# Patient Record
Sex: Female | Born: 1975 | Race: Black or African American | Hispanic: No | Marital: Married | Smoking: Never smoker
Health system: Southern US, Community
[De-identification: ages and names within clinical notes are randomized; demographics above are authoritative.]

## PROBLEM LIST (undated history)

## (undated) DIAGNOSIS — K219 Gastro-esophageal reflux disease without esophagitis: Secondary | ICD-10-CM

## (undated) DIAGNOSIS — E785 Hyperlipidemia, unspecified: Secondary | ICD-10-CM

## (undated) DIAGNOSIS — T7840XA Allergy, unspecified, initial encounter: Secondary | ICD-10-CM

## (undated) DIAGNOSIS — O24419 Gestational diabetes mellitus in pregnancy, unspecified control: Secondary | ICD-10-CM

## (undated) DIAGNOSIS — R002 Palpitations: Secondary | ICD-10-CM

## (undated) HISTORY — DX: Gestational diabetes mellitus in pregnancy, unspecified control: O24.419

## (undated) HISTORY — DX: Palpitations: R00.2

## (undated) HISTORY — DX: Gastro-esophageal reflux disease without esophagitis: K21.9

## (undated) HISTORY — DX: Hyperlipidemia, unspecified: E78.5

## (undated) HISTORY — DX: Allergy, unspecified, initial encounter: T78.40XA

---

## 2019-05-24 LAB — HM HIV SCREENING LAB: HM HIV Screening: NEGATIVE

## 2019-05-24 LAB — HM HEPATITIS C SCREENING LAB: HM Hepatitis Screen: NEGATIVE

## 2019-12-16 DIAGNOSIS — N390 Urinary tract infection, site not specified: Secondary | ICD-10-CM | POA: Diagnosis not present

## 2019-12-16 DIAGNOSIS — R300 Dysuria: Secondary | ICD-10-CM | POA: Diagnosis not present

## 2020-07-27 DIAGNOSIS — H18832 Recurrent erosion of cornea, left eye: Secondary | ICD-10-CM | POA: Diagnosis not present

## 2020-08-03 DIAGNOSIS — H18832 Recurrent erosion of cornea, left eye: Secondary | ICD-10-CM | POA: Diagnosis not present

## 2021-04-05 ENCOUNTER — Ambulatory Visit (HOSPITAL_BASED_OUTPATIENT_CLINIC_OR_DEPARTMENT_OTHER): Payer: Self-pay | Admitting: Nurse Practitioner

## 2021-04-22 ENCOUNTER — Encounter: Payer: Self-pay | Admitting: Nurse Practitioner

## 2021-04-22 ENCOUNTER — Other Ambulatory Visit (HOSPITAL_COMMUNITY)
Admission: RE | Admit: 2021-04-22 | Discharge: 2021-04-22 | Disposition: A | Discharge status: Home / Self Care | Payer: Federal, State, Local not specified - PPO | Source: Ambulatory Visit | Attending: Nurse Practitioner | Admitting: Nurse Practitioner

## 2021-04-22 ENCOUNTER — Ambulatory Visit: Payer: Federal, State, Local not specified - PPO | Admitting: Nurse Practitioner

## 2021-04-22 ENCOUNTER — Other Ambulatory Visit: Payer: Self-pay

## 2021-04-22 VITALS — BP 132/94 | HR 91 | Temp 97.3°F | Ht 62.0 in | Wt 162.8 lb

## 2021-04-22 DIAGNOSIS — R030 Elevated blood-pressure reading, without diagnosis of hypertension: Secondary | ICD-10-CM

## 2021-04-22 DIAGNOSIS — Z13220 Encounter for screening for lipoid disorders: Secondary | ICD-10-CM

## 2021-04-22 DIAGNOSIS — N898 Other specified noninflammatory disorders of vagina: Secondary | ICD-10-CM | POA: Diagnosis not present

## 2021-04-22 DIAGNOSIS — R002 Palpitations: Secondary | ICD-10-CM

## 2021-04-22 DIAGNOSIS — Z136 Encounter for screening for cardiovascular disorders: Secondary | ICD-10-CM

## 2021-04-22 LAB — LIPID PANEL
Cholesterol: 168 mg/dL (ref 0–200)
HDL: 49.3 mg/dL (ref >39.00)
LDL Cholesterol: 104 mg/dL — ABNORMAL HIGH (ref 0–99)
NonHDL: 118.36
Total CHOL/HDL Ratio: 3
Triglycerides: 71 mg/dL (ref 0.0–149.0)
VLDL: 14.2 mg/dL (ref 0.0–40.0)

## 2021-04-22 LAB — COMPREHENSIVE METABOLIC PANEL
ALT: 12 U/L (ref 0–35)
AST: 15 U/L (ref 0–37)
Albumin: 4.3 g/dL (ref 3.5–5.2)
Alkaline Phosphatase: 80 U/L (ref 39–117)
BUN: 6 mg/dL (ref 6–23)
CO2: 30 mEq/L (ref 19–32)
Calcium: 9.6 mg/dL (ref 8.4–10.5)
Chloride: 100 mEq/L (ref 96–112)
Creatinine, Ser: 0.64 mg/dL (ref 0.40–1.20)
GFR: 104.86 mL/min (ref >60.00)
Glucose, Bld: 227 mg/dL — ABNORMAL HIGH (ref 70–99)
Potassium: 3.6 mEq/L (ref 3.5–5.1)
Sodium: 136 mEq/L (ref 135–145)
Total Bilirubin: 0.6 mg/dL (ref 0.2–1.2)
Total Protein: 7.8 g/dL (ref 6.0–8.3)

## 2021-04-22 LAB — CBC WITH DIFFERENTIAL/PLATELET
Basophils Absolute: 0 10*3/uL (ref 0.0–0.1)
Basophils Relative: 0.4 % (ref 0.0–3.0)
Eosinophils Absolute: 0 10*3/uL (ref 0.0–0.7)
Eosinophils Relative: 0.5 % (ref 0.0–5.0)
HCT: 33.3 % — ABNORMAL LOW (ref 36.0–46.0)
Hemoglobin: 10.9 g/dL — ABNORMAL LOW (ref 12.0–15.0)
Lymphocytes Relative: 29.1 % (ref 12.0–46.0)
Lymphs Abs: 1.4 10*3/uL (ref 0.7–4.0)
MCHC: 32.7 g/dL (ref 30.0–36.0)
MCV: 76.5 fl — ABNORMAL LOW (ref 78.0–100.0)
Monocytes Absolute: 0.3 10*3/uL (ref 0.1–1.0)
Monocytes Relative: 7.2 % (ref 3.0–12.0)
Neutro Abs: 3 10*3/uL (ref 1.4–7.7)
Neutrophils Relative %: 62.8 % (ref 43.0–77.0)
Platelets: 306 10*3/uL (ref 150.0–400.0)
RBC: 4.35 Mil/uL (ref 3.87–5.11)
RDW: 14.7 % (ref 11.5–15.5)
WBC: 4.8 10*3/uL (ref 4.0–10.5)

## 2021-04-22 LAB — TSH: TSH: 1.89 u[IU]/mL (ref 0.35–5.50)

## 2021-04-22 NOTE — Progress Notes (Signed)
EKG interpreted by me on 04/22/21 showed normal sinus rhythm, heart rate 78, no ST or T wave changes. ? ?

## 2021-04-22 NOTE — Assessment & Plan Note (Addendum)
Endorses palpitations intermittently for the past 6 months.  She does have increased stress recently and her mom did pass away in October.  She had these before several years ago and had worn a Holter monitor which was unrevealing.  EKG done today shows normal sinus rhythm, heart rate 78, no ST or T wave changes.  Encouraged her to limit caffeine, which she does not drink a lot of anyway, and to make sure that she exercises.  Check CMP, CBC, TSH today.  We will place referral to cardiology since this has been going on for 6 months. ?

## 2021-04-22 NOTE — Patient Instructions (Signed)
It was great to see you! ? ?Your EKG was reassuring today. We are going to check some labs and will let you know the results. We will also let you know the results of your swab for the vaginal discharge.  ? ?I have placed a referral to cardiology.  ? ?Let's follow-up in 4 weeks, sooner if you have concerns. ? ?If a referral was placed today, you will be contacted for an appointment. Please note that routine referrals can sometimes take up to 3-4 weeks to process. Please call our office if you haven't heard anything after this time frame. ? ?Take care, ? ?Rodman Pickle, NP ? ?

## 2021-04-22 NOTE — Progress Notes (Signed)
? ?New Patient Office Visit ? ?Subjective:  ?Patient ID: Courtney Koch, female    DOB: 04-Jun-1975  Age: 46 y.o. MRN: [id number] ? ?CC:  ?Chief Complaint  ?Patient presents with  ? Establish Care  ?  Np. Est care. Pt c/o heart flutters x6 mos and mild abd cramps w/ some discharge for several days  ? ? ?HPI ?Courtney Koch presents for new patient visit to establish care.  Introduced to Publishing rights manager role and practice setting.  All questions answered.  Discussed provider/patient relationship and expectations. ? ?She has been experiencing some heart fluttering episodes for the past few months. This happened in 2005. She wore a monitor and was told nothing was wrong. This is intermittent, some weeks she doesn't have it. She doesn't drink any caffeine. She does endorse some stress at work. She also lost her mom in October. Denies chest pain and shortness of breath.  ? ?She also noted some vaginal discharge after her last menstrual cycle. She notices the discharge in the mornings and evenings. She does have intermittent cramping. Usually she only has cramps before her menstrual cycle. She describes the discharge as white. Denies odor, dysuria, recent antibiotic use.  ? ?Depression and anxiety screening done: ? ?Depression screen [facility] 2/9 04/22/2021  ?Down, Depressed, Hopeless 0  ?PHQ - 2 Score 0  ?Altered sleeping 0  ?Tired, decreased energy 0  ?Change in appetite 0  ?Feeling bad or failure about yourself  0  ?Trouble concentrating 0  ?Moving slowly or fidgety/restless 0  ?Suicidal thoughts 0  ?PHQ-9 Score 0  ? ?GAD 7 : Generalized Anxiety Score 04/22/2021  ?Nervous, Anxious, on Edge 0  ?Control/stop worrying 0  ?Worry too much - different things 0  ?Trouble relaxing 1  ?Restless 0  ?Easily annoyed or irritable 0  ?Afraid - awful might happen 0  ?Total GAD 7 Score 1  ? ? ?Past Medical History:  ?Diagnosis Date  ? Allergy 06/2000  ? Gestational diabetes   ? ? ?Past Surgical History:  ?Procedure Laterality Date  ? CESAREAN  SECTION  06/2000, 06/2002, 12/2007  ? ? ?History reviewed. No pertinent family history. ? ?Social History  ? ?Socioeconomic History  ? Marital status: Married  ?  Spouse name: Not on file  ? Number of children: Not on file  ? Years of education: Not on file  ? Highest education level: Not on file  ?Occupational History  ? Not on file  ?Tobacco Use  ? Smoking status: Never  ? Smokeless tobacco: Never  ?Vaping Use  ? Vaping Use: Never used  ?Substance and Sexual Activity  ? Alcohol use: Yes  ?  Alcohol/week: 1.0 standard drink  ?  Types: 1 Glasses of wine per week  ?  Comment: Occasionally when we go out.  One drink every 4 or so mths.  ? Drug use: Never  ? Sexual activity: Yes  ?  Birth control/protection: None  ?Other Topics Concern  ? Not on file  ?Social History Narrative  ? Not on file  ? ?Social Determinants of Health  ? ?Financial Resource Strain: Not on file  ?Food Insecurity: Not on file  ?Transportation Needs: Not on file  ?Physical Activity: Not on file  ?Stress: Not on file  ?Social Connections: Not on file  ?Intimate Partner Violence: Not on file  ? ? ?ROS ?Review of Systems  ?Constitutional:  Positive for fatigue.  ?HENT: Negative.    ?Eyes: Negative.   ?Respiratory: Negative.    ?Cardiovascular:  Positive  for palpitations. Negative for chest pain and leg swelling.  ?Gastrointestinal: Negative.   ?Genitourinary:  Positive for vaginal discharge. Negative for dysuria and frequency.  ?Musculoskeletal: Negative.   ?Skin: Negative.   ?Neurological: Negative.   ?Psychiatric/Behavioral:  Positive for sleep disturbance.   ? ?Objective:  ? ?Today's Vitals: BP (!) 132/94 (BP Location: Right Arm, Cuff Size: Normal)   Pulse 91   Temp (!) 97.3 ?F (36.3 ?C) (Temporal)   Ht 5\' 2"  (1.575 m)   Wt 162 lb 12.8 oz (73.8 kg)   LMP 04/10/2021 (Exact Date)   SpO2 100%   BMI 29.78 kg/m?  ? ?Physical Exam ?Vitals and nursing note reviewed.  ?Constitutional:   ?   General: She is not in acute distress. ?   Appearance: Normal  appearance.  ?HENT:  ?   Head: Normocephalic and atraumatic.  ?   Right Ear: Tympanic membrane, ear canal and external ear normal.  ?   Left Ear: Tympanic membrane, ear canal and external ear normal.  ?   Nose: Nose normal.  ?   Mouth/Throat:  ?   Mouth: Mucous membranes are moist.  ?   Pharynx: Oropharynx is clear.  ?Eyes:  ?   Conjunctiva/sclera: Conjunctivae normal.  ?Cardiovascular:  ?   Rate and Rhythm: Normal rate and regular rhythm.  ?   Pulses: Normal pulses.  ?   Heart sounds: Normal heart sounds.  ?Pulmonary:  ?   Effort: Pulmonary effort is normal.  ?   Breath sounds: Normal breath sounds.  ?Abdominal:  ?   General: Bowel sounds are normal.  ?   Palpations: Abdomen is soft.  ?   Tenderness: There is no abdominal tenderness.  ?Musculoskeletal:     ?   General: Normal range of motion.  ?   Cervical back: Normal range of motion. No tenderness.  ?   Right lower leg: No edema.  ?   Left lower leg: No edema.  ?Lymphadenopathy:  ?   Cervical: No cervical adenopathy.  ?Skin: ?   General: Skin is warm and dry.  ?Neurological:  ?   General: No focal deficit present.  ?   Mental Status: She is alert and oriented to person, place, and time.  ?Psychiatric:     ?   Mood and Affect: Mood normal.     ?   Behavior: Behavior normal.     ?   Thought Content: Thought content normal.     ?   Judgment: Judgment normal.  ? ? ?Assessment & Plan:  ? ?Problem List Items Addressed This Visit   ? ?  ? Other  ? Palpitations - Primary  ?  Endorses palpitations intermittently for the past 6 months.  She does have increased stress recently and her mom did pass away in October.  She had these before several years ago and had worn a Holter monitor which was unrevealing.  EKG done today shows normal sinus rhythm, heart rate 78, no ST or T wave changes.  Encouraged her to limit caffeine, which she does not drink a lot of anyway, and to make sure that she exercises.  Check CMP, CBC, TSH today.  We will place referral to cardiology since this  has been going on for 6 months. ?  ?  ? Relevant Orders  ? EKG 12-Lead (Completed)  ? CBC with Differential/Platelet  ? Comprehensive metabolic panel  ? TSH  ? Ambulatory referral to Cardiology  ? ?Other Visit Diagnoses   ? ?  Vaginal discharge      ? No abdominal pain on exam.  We will check wet prep for BV, yeast, trichomoniasis.  Will treat based on results  ? Relevant Orders  ? Cervicovaginal ancillary only  ? Encounter for lipid screening for cardiovascular disease      ? Relevant Orders  ? Lipid panel  ? ?  ? ? ?No outpatient encounter medications on file as of 04/22/2021.  ? ?No facility-administered encounter medications on file as of 04/22/2021.  ? ? ?Follow-up: Return in about 4 weeks (around 05/20/2021) for CPE.  ? ?Gerre ScullLauren A Rithwik Schmieg, NP ? ?

## 2021-04-22 NOTE — Assessment & Plan Note (Signed)
BP 132/94 today on recheck.  Encouraged her to start checking her blood pressure at home daily and writing it down.  Encouraged her to limit the amount of salt in her diet and make sure that she is exercising.  Follow-up in 4 weeks. ?

## 2021-04-25 ENCOUNTER — Other Ambulatory Visit: Payer: Self-pay

## 2021-04-25 ENCOUNTER — Encounter: Payer: Self-pay | Admitting: Nurse Practitioner

## 2021-04-25 ENCOUNTER — Ambulatory Visit: Payer: Federal, State, Local not specified - PPO | Admitting: Internal Medicine

## 2021-04-25 VITALS — BP 132/84 | HR 100 | Ht 62.0 in | Wt 156.0 lb

## 2021-04-25 DIAGNOSIS — R002 Palpitations: Secondary | ICD-10-CM | POA: Diagnosis not present

## 2021-04-25 DIAGNOSIS — N898 Other specified noninflammatory disorders of vagina: Secondary | ICD-10-CM

## 2021-04-25 LAB — CERVICOVAGINAL ANCILLARY ONLY
Bacterial Vaginitis (gardnerella): NEGATIVE
Candida Glabrata: NEGATIVE
Candida Vaginitis: NEGATIVE
Comment: NEGATIVE
Comment: NEGATIVE
Comment: NEGATIVE
Comment: NEGATIVE
Trichomonas: NEGATIVE

## 2021-04-25 NOTE — Patient Instructions (Signed)

## 2021-04-25 NOTE — Progress Notes (Signed)
?Cardiology Office Note:   ? ?Date:  04/25/2021  ? ?ID:  Courtney Koch, DOB [birth year], MRN [id number] ? ?PCP:  Charyl Dancer, NP ?  ?[facility] Providers ?Cardiologist:  None    ? ?Referring MD: Charyl Dancer, NP  ? ?No chief complaint on file. ?Palpitations ? ?History of Present Illness:   ? ?Courtney Koch is a 46 y.o. female with a hx of gestational DM, allergies referral from PCP for palpitations ? ?She notes heart flutters. She noted it in 2005. She wore a heart monitor and it was normal. She feels more frequent symptoms.  It lasts for seconds. About 5 seconds.  No caffeine. No syncope. She notes symptoms more since her mom's passing 6 months ago.  ? ?EKG-NSR ? ?Past Medical History:  ?Diagnosis Date  ? Allergy 06/2000  ? Gestational diabetes   ? ? ?Past Surgical History:  ?Procedure Laterality Date  ? CESAREAN SECTION  06/2000, 06/2002, 12/2007  ? ? ?Current Medications: ?No outpatient medications have been marked as taking for the 04/25/21 encounter (Appointment) with Janina Mayo, MD.  ?  ? ?Allergies:   Percocet [oxycodone-acetaminophen]  ? ?Social History  ? ?Socioeconomic History  ? Marital status: Married  ?  Spouse name: Not on file  ? Number of children: Not on file  ? Years of education: Not on file  ? Highest education level: Not on file  ?Occupational History  ? Not on file  ?Tobacco Use  ? Smoking status: Never  ? Smokeless tobacco: Never  ?Vaping Use  ? Vaping Use: Never used  ?Substance and Sexual Activity  ? Alcohol use: Yes  ?  Alcohol/week: 1.0 standard drink  ?  Types: 1 Glasses of wine per week  ?  Comment: Occasionally when we go out.  One drink every 4 or so mths.  ? Drug use: Never  ? Sexual activity: Yes  ?  Birth control/protection: None  ?Other Topics Concern  ? Not on file  ?Social History Narrative  ? Not on file  ? ?Social Determinants of Health  ? ?Financial Resource Strain: Not on file  ?Food Insecurity: Not on file  ?Transportation Needs: Not on file  ?Physical  Activity: Not on file  ?Stress: Not on file  ?Social Connections: Not on file  ?  ? ?Family History: ?The patient's no family hx of heart disease or SCD ? ?ROS:   ?Please see the history of present illness.    ? All other systems reviewed and are negative. ? ?EKGs/Labs/Other Studies Reviewed:   ? ?The following studies were reviewed today: ? ? ?EKG:  EKG is  ordered today.  The ekg ordered today demonstrates  ? ?NSR, no interval changes ? ?Recent Labs: ?04/22/2021: ALT 12; BUN 6; Creatinine, Ser 0.64; Hemoglobin 10.9; Platelets 306.0; Potassium 3.6; Sodium 136; TSH 1.89  ?Recent Lipid Panel ?   ?Component Value Date/Time  ? CHOL 168 04/22/2021 1137  ? TRIG 71.0 04/22/2021 1137  ? HDL 49.30 04/22/2021 1137  ? CHOLHDL 3 04/22/2021 1137  ? VLDL 14.2 04/22/2021 1137  ? [place] 104 (H) 04/22/2021 1137  ? ? ? ?Risk Assessment/Calculations:   ?  ? ?    ? ?Physical Exam:   ? ?VS:  LMP 04/10/2021 (Exact Date)    ? ?Wt Readings from Last 3 Encounters:  ?04/22/21 162 lb 12.8 oz (73.8 kg)  ?  ? ?GEN:  Well nourished, well developed in no acute distress ?HEENT: Normal ?NECK: No JVD; No carotid bruits ?  LYMPHATICS: No lymphadenopathy ?CARDIAC: RRR, no murmurs, rubs, gallops ?RESPIRATORY:  Clear to auscultation without rales, wheezing or rhonchi  ?ABDOMEN: Soft, non-tender, non-distended ?MUSCULOSKELETAL:  No edema; No deformity  ?SKIN: Warm and dry ?NEUROLOGIC:  Alert and oriented x 3 ?PSYCHIATRIC:  Normal affect  ? ?ASSESSMENT:   ? ?#Palpitations: Only lasts a few seconds. No LH, dizziness. She does not have high risk features including syncope c/f arrhythmia , family hx of SCD, or abnormalities on her EKG. Discussed may be related to grief/anxiety. We discussed that if she develops long standing palpitations, LH, dizziness or syncope to let us know; otherwise she does not need to continue with following cardiology ? ?PLAN:   ? ?In order of problems listed above: ? ?Follow up PRN ? ?   ? ?   ? ? ?Medication Adjustments/Labs and  Tests Ordered: ?Current medicines are reviewed at length with the patient today.  Concerns regarding medicines are outlined above.  ?No orders of the defined types were placed in this encounter. ? ?No orders of the defined types were placed in this encounter. ? ? ?There are no Patient Instructions on file for this visit.  ? ?Signed, ?Janina Mayo, MD  ?04/25/2021 1:22 PM    ?[place] ?

## 2021-04-29 NOTE — Progress Notes (Signed)
? ?Established Patient Office Visit ? ?Subjective:  ?Patient ID: Courtney Koch, female    DOB: 08/26/1975  Age: 46 y.o. MRN: [id number] ? ?CC:  ?Chief Complaint  ?Patient presents with  ? Blood Sugar Problem  ?  F/u elevated BS  ? ? ?HPI ?[name] presents for follow-up on glucose from recent labs.  Her glucose was 227.  She has a history of gestational diabetes, her last was 13 years ago.  She denies any signs of high blood sugars including change in vision, urinary frequency, extra thirsty/hungry, numbness or tingling in her feet or hands.  She denies chest pain, shortness of breath. ? ?Past Medical History:  ?Diagnosis Date  ? Allergy 06/2000  ? Gestational diabetes   ? ? ?Past Surgical History:  ?Procedure Laterality Date  ? CESAREAN SECTION  06/2000, 06/2002, 12/2007  ? ? ?History reviewed. No pertinent family history. ? ?Social History  ? ?Socioeconomic History  ? Marital status: Married  ?  Spouse name: Not on file  ? Number of children: Not on file  ? Years of education: Not on file  ? Highest education level: Not on file  ?Occupational History  ? Not on file  ?Tobacco Use  ? Smoking status: Never  ? Smokeless tobacco: Never  ?Vaping Use  ? Vaping Use: Never used  ?Substance and Sexual Activity  ? Alcohol use: Yes  ?  Alcohol/week: 1.0 standard drink  ?  Types: 1 Glasses of wine per week  ?  Comment: Occasionally when we go out.  One drink every 4 or so mths.  ? Drug use: Never  ? Sexual activity: Yes  ?  Birth control/protection: None  ?Other Topics Concern  ? Not on file  ?Social History Narrative  ? Not on file  ? ?Social Determinants of Health  ? ?Financial Resource Strain: Not on file  ?Food Insecurity: Not on file  ?Transportation Needs: Not on file  ?Physical Activity: Not on file  ?Stress: Not on file  ?Social Connections: Not on file  ?Intimate Partner Violence: Not on file  ? ? ?No outpatient medications prior to visit.  ? ?No facility-administered medications prior to visit.  ? ? ?Allergies   ?Allergen Reactions  ? Percocet [Oxycodone-Acetaminophen] Rash and Swelling  ? ? ?ROS ?Review of Systems ?See pertinent positives and negatives per HPI. ?  ?Objective:  ?  ?Physical Exam ?Vitals and nursing note reviewed.  ?Constitutional:   ?   General: She is not in acute distress. ?   Appearance: Normal appearance.  ?HENT:  ?   Head: Normocephalic and atraumatic.  ?Eyes:  ?   Conjunctiva/sclera: Conjunctivae normal.  ?Cardiovascular:  ?   Rate and Rhythm: Normal rate.  ?Pulmonary:  ?   Effort: Pulmonary effort is normal.  ?Musculoskeletal:  ?   Cervical back: Normal range of motion.  ?Skin: ?   General: Skin is warm and dry.  ?Neurological:  ?   General: No focal deficit present.  ?   Mental Status: She is alert and oriented to person, place, and time.  ?Psychiatric:     ?   Mood and Affect: Mood normal.     ?   Behavior: Behavior normal.     ?   Thought Content: Thought content normal.     ?   Judgment: Judgment normal.  ? ? ?BP 132/90 (BP Location: Right Arm, Cuff Size: Normal)   Pulse 83   Temp (!) 96.4 ?F (35.8 ?C) (Temporal)   Wt 155  lb 9.6 oz (70.6 kg)   LMP 04/10/2021 (Exact Date)   SpO2 94%   BMI 28.46 kg/m?  ?Wt Readings from Last 3 Encounters:  ?05/02/21 155 lb 9.6 oz (70.6 kg)  ?04/25/21 156 lb (70.8 kg)  ?04/22/21 162 lb 12.8 oz (73.8 kg)  ? ? ? ?Health Maintenance Due  ?Topic Date Due  ? FOOT EXAM  Never done  ? OPHTHALMOLOGY EXAM  Never done  ? HIV Screening  Never done  ? Hepatitis C Screening  Never done  ? TETANUS/TDAP  Never done  ? PAP SMEAR-Modifier  Never done  ? COLONOSCOPY (Pts 45-33yr Insurance coverage will need to be confirmed)  Never done  ? ? ?There are no preventive care reminders to display for this patient. ? ?Lab Results  ?Component Value Date  ? TSH 1.89 04/22/2021  ? ?Lab Results  ?Component Value Date  ? WBC 4.8 04/22/2021  ? HGB 10.9 (L) 04/22/2021  ? HCT 33.3 (L) 04/22/2021  ? MCV 76.5 (L) 04/22/2021  ? PLT 306.0 04/22/2021  ? ?Lab Results  ?Component Value Date  ? NA  136 04/22/2021  ? K 3.6 04/22/2021  ? CO2 30 04/22/2021  ? GLUCOSE 227 (H) 04/22/2021  ? BUN 6 04/22/2021  ? CREATININE 0.64 04/22/2021  ? BILITOT 0.6 04/22/2021  ? ALKPHOS 80 04/22/2021  ? AST 15 04/22/2021  ? ALT 12 04/22/2021  ? PROT 7.8 04/22/2021  ? ALBUMIN 4.3 04/22/2021  ? CALCIUM 9.6 04/22/2021  ? GFR 104.86 04/22/2021  ? ?Lab Results  ?Component Value Date  ? CHOL 168 04/22/2021  ? ?Lab Results  ?Component Value Date  ? HDL 49.30 04/22/2021  ? ?Lab Results  ?Component Value Date  ? LDLCALC 104 (H) 04/22/2021  ? ?Lab Results  ?Component Value Date  ? TRIG 71.0 04/22/2021  ? ?Lab Results  ?Component Value Date  ? CHOLHDL 3 04/22/2021  ? ?Lab Results  ?Component Value Date  ? HGBA1C 9.0 (A) 05/02/2021  ? HGBA1C 9.0 05/02/2021  ? HGBA1C 9.0 (A) 05/02/2021  ? HGBA1C 9.0 (A) 05/02/2021  ? ? ?  ?Assessment & Plan:  ? ?Problem List Items Addressed This Visit   ? ?  ? Endocrine  ? Diabetes mellitus without complication (HAmasa  ?  A1c today is 9.0%.  She does have a history of gestational diabetes, her last pregnancy was 13 years ago.  Discussed limiting carbs and added sugars, along with exercise.  She would like to try just changing her lifestyle first before starting any medications.  Discussed yearly eye exam.  Also discussed starting statin for cardiovascular protection and ACE/ARB for kidney protection.  We will discuss this further at next visit.  Discussed checking her fasting blood sugars every morning.  We will refer her to nutritionist to help with diet.  Follow-up in 3 months or sooner with concerns. ?  ?  ? Relevant Orders  ? Amb ref to Medical Nutrition Therapy-MNT  ? ?Other Visit Diagnoses   ? ? IFG (impaired fasting glucose)    -  Primary  ? Glucose 227 on last labs on 04/22/2021.  A1c today 9.0%.  See plan for diabetes.  ? Relevant Orders  ? POCT HgB A1C (Completed)  ? ?  ? ? ?Meds ordered this encounter  ?Medications  ? Blood Glucose Monitoring Suppl (ONE TOUCH ULTRA 2) w/Device KIT  ?  Sig: 1 each by  Does not apply route daily.  ?  Dispense:  1 kit  ?  Refill:  0  ? ? ?Follow-up: Return in about 3 months (around 08/02/2021) for Diabetes.  ? ?A total of 30 minutes were spent on this encounter today. When total time is documented, this includes both the face-to-face and non-face-to-face time personally spent before, during and after the visit on the date of the encounter.  Specifically we discussed the diagnosis of diabetes, possible complications, monitoring blood sugar at home, dietary changes, yearly eye exams.  She was given the opportunity to ask questions.  We also discussed medications including glucose lowering, statins for cardiovascular protection, ACE/ARB for kidney protection. ? ? ?Charyl Dancer, NP ?

## 2021-05-02 ENCOUNTER — Ambulatory Visit: Payer: Federal, State, Local not specified - PPO | Admitting: Nurse Practitioner

## 2021-05-02 ENCOUNTER — Ambulatory Visit: Payer: Self-pay | Admitting: Nurse Practitioner

## 2021-05-02 ENCOUNTER — Encounter: Payer: Self-pay | Admitting: Nurse Practitioner

## 2021-05-02 VITALS — BP 132/90 | HR 83 | Temp 96.4°F | Wt 155.6 lb

## 2021-05-02 DIAGNOSIS — E119 Type 2 diabetes mellitus without complications: Secondary | ICD-10-CM

## 2021-05-02 DIAGNOSIS — R7301 Impaired fasting glucose: Secondary | ICD-10-CM | POA: Diagnosis not present

## 2021-05-02 LAB — POCT GLYCOSYLATED HEMOGLOBIN (HGB A1C)
HbA1c POC (<> result, manual entry): 9 % (ref 4.0–5.6)
HbA1c, POC (controlled diabetic range): 9 % — AB (ref 0.0–7.0)
HbA1c, POC (prediabetic range): 9 % — AB (ref 5.7–6.4)
Hemoglobin A1C: 9 % — AB (ref 4.0–5.6)

## 2021-05-02 MED ORDER — ONETOUCH ULTRA 2 W/DEVICE KIT: 1.0000 | PACK | Freq: Every day | 0 refills | Status: AC

## 2021-05-02 NOTE — Assessment & Plan Note (Signed)
A1c today is 9.0%.  She does have a history of gestational diabetes, her last pregnancy was 13 years ago.  Discussed limiting carbs and added sugars, along with exercise.  She would like to try just changing her lifestyle first before starting any medications.  Discussed yearly eye exam.  Also discussed starting statin for cardiovascular protection and ACE/ARB for kidney protection.  We will discuss this further at next visit.  Discussed checking her fasting blood sugars every morning.  We will refer her to nutritionist to help with diet.  Follow-up in 3 months or sooner with concerns. ?

## 2021-05-02 NOTE — Patient Instructions (Signed)
It was great to see you! ? ?Your blood sugar every morning before you eat.  The goal is to have your sugar less than 130.  If you decide to check it later on in the day, your sugar should be less than 200-2 hours after eating.  ? ?I recommend that you follow up with your eye doctor yearly.  I also recommend that you look at your feet daily to make sure that you are not getting any blisters or wounds that you are we are not able to feel. ? ?I am placing a referral to a dietitian, they should be calling you to schedule this visit.  I am also attaching a bunch of information for you to look through on diabetes. ? ?We will consider starting a medication to protect your kidneys, and a statin to prevent heart attack and stroke at your next visit. ? ?Let's follow-up in 3 months, sooner if you have concerns. ? ?If a referral was placed today, you will be contacted for an appointment. Please note that routine referrals can sometimes take up to 3-4 weeks to process. Please call our office if you haven't heard anything after this time frame. ? ?Take care, ? ?Rodman Pickle, NP ? ?

## 2021-05-03 MED ORDER — ONETOUCH ULTRA VI STRP: ORAL_STRIP | 12 refills | Status: DC

## 2021-05-03 NOTE — Progress Notes (Signed)
? ? ?McElwee, Lauren A, NP ? ? ?Chief Complaint  ?Patient presents with  ? Referral  ?  Clear of sx today, was having vag discharge only.   ? ? ?HPI: ?     [name] is a 46 y.o. No obstetric history on file. whose LMP was Patient's last menstrual period was 04/10/2021 (exact date)., presents today for NP eval of increased vag d/c, no itching/irritation/odor; referred by PCP. Had neg culture for BV, yeast, trich 04/22/21. Sx were after her period and pt states they have since resolved.  ?She has not had a recent pap smear, no hx of abn paps.  ?She is sex active, partner with vasectomy. No new partners. No bleeding/dryness with sex. Has noticed some RLQ cramping after sex recently, lasting about 10 min. No pain during sex. No hx of ovar cysts, no GI sx.  ? ?Patient Active Problem List  ? Diagnosis Date Noted  ? Diabetes mellitus without complication ([place]) 82/50/5397  ? Palpitations 04/22/2021  ? Elevated blood-pressure reading without diagnosis of hypertension 04/22/2021  ? ? ?Past Surgical History:  ?Procedure Laterality Date  ? CESAREAN SECTION  06/2000, 06/2002, 12/2007  ? ? ?History reviewed. No pertinent family history. ? ?Social History  ? ?Socioeconomic History  ? Marital status: Married  ?  Spouse name: Not on file  ? Number of children: Not on file  ? Years of education: Not on file  ? Highest education level: Not on file  ?Occupational History  ? Not on file  ?Tobacco Use  ? Smoking status: Never  ? Smokeless tobacco: Never  ?Vaping Use  ? Vaping Use: Never used  ?Substance and Sexual Activity  ? Alcohol use: Yes  ?  Alcohol/week: 1.0 standard drink  ?  Types: 1 Glasses of wine per week  ?  Comment: Occasionally when we go out.  One drink every 4 or so mths.  ? Drug use: Never  ? Sexual activity: Yes  ?  Birth control/protection: None  ?Other Topics Concern  ? Not on file  ?Social History Narrative  ? Not on file  ? ?Social Determinants of Health  ? ?Financial Resource Strain: Not on file  ?Food  Insecurity: Not on file  ?Transportation Needs: Not on file  ?Physical Activity: Not on file  ?Stress: Not on file  ?Social Connections: Not on file  ?Intimate Partner Violence: Not on file  ? ? ?Outpatient Medications Prior to Visit  ?Medication Sig Dispense Refill  ? Blood Glucose Monitoring Suppl (ONE TOUCH ULTRA 2) w/Device KIT 1 each by Does not apply route daily. 1 kit 0  ? glucose blood (ONETOUCH ULTRA) test strip Use as instructed 100 each 12  ? ?No facility-administered medications prior to visit.  ? ? ? ? ?ROS: ? ?Review of Systems  ?Constitutional:  Negative for fever.  ?Gastrointestinal:  Negative for blood in stool, constipation, diarrhea, nausea and vomiting.  ?Genitourinary:  Negative for dyspareunia, dysuria, flank pain, frequency, hematuria, urgency, vaginal bleeding, vaginal discharge and vaginal pain.  ?Musculoskeletal:  Negative for back pain.  ?Skin:  Negative for rash.  ?BREAST: No symptoms ? ? ?OBJECTIVE:  ? ?Vitals:  ?BP 110/80   Ht _0  (1.575 m)   Wt 155 lb (70.3 kg)   LMP 04/10/2021 (Exact Date)   BMI 28.35 kg/m?  ? ?Physical Exam ?Vitals reviewed.  ?Constitutional:   ?   Appearance: She is well-developed.  ?Pulmonary:  ?   Effort: Pulmonary effort is normal.  ?Abdominal:  ?  Palpations: Abdomen is soft.  ?   Tenderness: There is no abdominal tenderness.  ?Genitourinary: ?   General: Normal vulva.  ?   Pubic Area: No rash.   ?   Labia:     ?   Right: No rash, tenderness or lesion.     ?   Left: No rash, tenderness or lesion.   ?   Vagina: Normal. No vaginal discharge, erythema or tenderness.  ?   Cervix: Normal.  ?   Uterus: Normal. Not enlarged and not tender.   ?   Adnexa: Right adnexa normal and left adnexa normal.    ?   Right: No mass or tenderness.      ?   Left: No mass or tenderness.    ?Musculoskeletal:     ?   General: Normal range of motion.  ?   Cervical back: Normal range of motion.  ?Skin: ?   General: Skin is warm and dry.  ?Neurological:  ?   General: No focal deficit  present.  ?   Mental Status: She is alert and oriented to person, place, and time.  ?Psychiatric:     ?   Mood and Affect: Mood normal.     ?   Behavior: Behavior normal.     ?   Thought Content: Thought content normal.     ?   Judgment: Judgment normal.  ? ? ?Assessment/Plan: ?Vaginal discharge--sx resolved. F/u prn. Reassurance.  ? ?Cervical cancer screening - Plan: Cytology - PAP ? ?Screening for HPV (human papillomavirus) - Plan: Cytology - PAP ? ? ? Return if symptoms worsen or fail to improve. ? ?Collier Monica B. Dione Petron, PA-C ?05/04/2021 ?9:53 AM ? ? ? ? ? ?

## 2021-05-04 ENCOUNTER — Other Ambulatory Visit (HOSPITAL_COMMUNITY)
Admission: RE | Admit: 2021-05-04 | Discharge: 2021-05-04 | Disposition: A | Discharge status: Home / Self Care | Payer: Federal, State, Local not specified - PPO | Source: Ambulatory Visit | Attending: Obstetrics and Gynecology | Admitting: Obstetrics and Gynecology

## 2021-05-04 ENCOUNTER — Other Ambulatory Visit: Payer: Self-pay

## 2021-05-04 ENCOUNTER — Encounter: Payer: Self-pay | Admitting: Obstetrics and Gynecology

## 2021-05-04 ENCOUNTER — Other Ambulatory Visit: Payer: Self-pay | Admitting: Nurse Practitioner

## 2021-05-04 ENCOUNTER — Ambulatory Visit: Payer: Federal, State, Local not specified - PPO | Admitting: Obstetrics and Gynecology

## 2021-05-04 VITALS — BP 110/80 | Ht 62.0 in | Wt 155.0 lb

## 2021-05-04 DIAGNOSIS — Z124 Encounter for screening for malignant neoplasm of cervix: Secondary | ICD-10-CM

## 2021-05-04 DIAGNOSIS — Z1231 Encounter for screening mammogram for malignant neoplasm of breast: Secondary | ICD-10-CM

## 2021-05-04 DIAGNOSIS — N898 Other specified noninflammatory disorders of vagina: Secondary | ICD-10-CM | POA: Diagnosis not present

## 2021-05-04 DIAGNOSIS — Z1151 Encounter for screening for human papillomavirus (HPV): Secondary | ICD-10-CM | POA: Diagnosis not present

## 2021-05-04 NOTE — Patient Instructions (Signed)
I value your feedback and you entrusting us with your care. If you get a [facility] patient survey, I would appreciate you taking the time to let us know about your experience today. Thank you! ? ? ?

## 2021-05-05 LAB — CYTOLOGY - PAP
Comment: NEGATIVE
Diagnosis: NEGATIVE
High risk HPV: NEGATIVE

## 2021-05-13 ENCOUNTER — Ambulatory Visit
Admission: RE | Admit: 2021-05-13 | Discharge: 2021-05-13 | Disposition: A | Discharge status: Home / Self Care | Payer: Federal, State, Local not specified - PPO | Source: Ambulatory Visit

## 2021-05-13 DIAGNOSIS — Z1231 Encounter for screening mammogram for malignant neoplasm of breast: Secondary | ICD-10-CM | POA: Diagnosis not present

## 2021-05-20 ENCOUNTER — Encounter: Payer: Federal, State, Local not specified - PPO | Admitting: Nurse Practitioner

## 2021-05-23 ENCOUNTER — Encounter: Payer: Self-pay | Admitting: Nurse Practitioner

## 2021-05-23 ENCOUNTER — Ambulatory Visit (INDEPENDENT_AMBULATORY_CARE_PROVIDER_SITE_OTHER): Payer: Federal, State, Local not specified - PPO | Admitting: Nurse Practitioner

## 2021-05-23 VITALS — BP 116/74 | HR 83 | Temp 97.8°F | Resp 18 | Wt 155.0 lb

## 2021-05-23 DIAGNOSIS — Z1211 Encounter for screening for malignant neoplasm of colon: Secondary | ICD-10-CM

## 2021-05-23 DIAGNOSIS — E119 Type 2 diabetes mellitus without complications: Secondary | ICD-10-CM | POA: Diagnosis not present

## 2021-05-23 DIAGNOSIS — Z0000 Encounter for general adult medical examination without abnormal findings: Secondary | ICD-10-CM

## 2021-05-23 DIAGNOSIS — R030 Elevated blood-pressure reading, without diagnosis of hypertension: Secondary | ICD-10-CM

## 2021-05-23 DIAGNOSIS — Z23 Encounter for immunization: Secondary | ICD-10-CM | POA: Diagnosis not present

## 2021-05-23 NOTE — Progress Notes (Signed)
? ?BP 116/74 (BP Location: Left Arm, Patient Position: Sitting, Cuff Size: Normal)   Pulse 83   Temp 97.8 ?F (36.6 ?C) (Temporal)   Resp 18   Wt 155 lb (70.3 kg)   LMP 05/07/2021 (Exact Date)   SpO2 99%   BMI 28.35 kg/m?   ? ?Subjective:  ? ? Patient ID: Courtney Koch, female    DOB: January 19, 1976, 46 y.o.   MRN: [id number] ? ?CC: ?Chief Complaint  ?Patient presents with  ? Annual Exam  ?  CPE, pt is fasting   ? ?HPI: ?Courtney Koch is a 46 y.o. female presenting on 05/23/2021 for comprehensive medical examination. Current medical complaints include:none ? ?She has been checking her fasting blood sugar daily, and it has ranged from 80-1 20.  She also checked it once 2 hours after eating, and it was 190.  She has been checking her blood pressures at home as well and they have been running 90s-120s/60s-80s. ? ?Depression Screen done today and results listed below:  ? ?  05/23/2021  ? 11:10 AM 04/22/2021  ? 11:14 AM  ?Depression screen PHQ 2/9  ?Decreased Interest 0   ?Down, Depressed, Hopeless 0 0  ?PHQ - 2 Score 0 0  ?Altered sleeping 1 0  ?Tired, decreased energy 0 0  ?Change in appetite 0 0  ?Feeling bad or failure about yourself  0 0  ?Trouble concentrating 0 0  ?Moving slowly or fidgety/restless 0 0  ?Suicidal thoughts 0 0  ?PHQ-9 Score 1 0  ?Difficult doing work/chores Not difficult at all   ? ? ?The patient does not have a history of falls. I did not complete a risk assessment for falls. A plan of care for falls was not documented. ? ? ?Past Medical History:  ?Past Medical History:  ?Diagnosis Date  ? Allergy 06/2000  ? Gestational diabetes   ? ? ?Surgical History:  ?Past Surgical History:  ?Procedure Laterality Date  ? CESAREAN SECTION  06/2000, 06/2002, 12/2007  ? ? ?Medications:  ?Current Outpatient Medications on File Prior to Visit  ?Medication Sig  ? Blood Glucose Monitoring Suppl (ONE TOUCH ULTRA 2) w/Device KIT 1 each by Does not apply route daily.  ? glucose blood (ONETOUCH ULTRA) test strip Use as  instructed  ? ?No current facility-administered medications on file prior to visit.  ? ? ?Allergies:  ?Allergies  ?Allergen Reactions  ? Percocet [Oxycodone-Acetaminophen] Rash and Swelling  ? ? ?Social History:  ?Social History  ? ?Socioeconomic History  ? Marital status: Married  ?  Spouse name: Not on file  ? Number of children: Not on file  ? Years of education: Not on file  ? Highest education level: Not on file  ?Occupational History  ? Not on file  ?Tobacco Use  ? Smoking status: Never  ? Smokeless tobacco: Never  ?Vaping Use  ? Vaping Use: Never used  ?Substance and Sexual Activity  ? Alcohol use: Yes  ?  Alcohol/week: 1.0 standard drink  ?  Types: 1 Glasses of wine per week  ?  Comment: Occasionally when we go out.  One drink every 4 or so mths.  ? Drug use: Never  ? Sexual activity: Yes  ?  Birth control/protection: None  ?Other Topics Concern  ? Not on file  ?Social History Narrative  ? Not on file  ? ?Social Determinants of Health  ? ?Financial Resource Strain: Not on file  ?Food Insecurity: Not on file  ?Transportation Needs: Not on file  ?Physical  Activity: Not on file  ?Stress: Not on file  ?Social Connections: Not on file  ?Intimate Partner Violence: Not on file  ? ?Social History  ? ?Tobacco Use  ?Smoking Status Never  ?Smokeless Tobacco Never  ? ?Social History  ? ?Substance and Sexual Activity  ?Alcohol Use Yes  ? Alcohol/week: 1.0 standard drink  ? Types: 1 Glasses of wine per week  ? Comment: Occasionally when we go out.  One drink every 4 or so mths.  ? ? ?Family History:  ?Family History  ?Problem Relation Age of Onset  ? BRCA 1/2 Neg Hx   ? Breast cancer Neg Hx   ? ? ?Past medical history, surgical history, medications, allergies, family history and social history reviewed with patient today and changes made to appropriate areas of the chart.  ? ?Review of Systems  ?Constitutional: Negative.   ?HENT: Negative.    ?Eyes: Negative.   ?Respiratory: Negative.    ?Cardiovascular: Negative.    ?Gastrointestinal: Negative.   ?Genitourinary: Negative.   ?Musculoskeletal: Negative.   ?Skin: Negative.   ?Neurological: Negative.   ?Psychiatric/Behavioral: Negative.    ?All other ROS negative except what is listed above and in the HPI.  ? ?   ?Objective:  ?  ?BP 116/74 (BP Location: Left Arm, Patient Position: Sitting, Cuff Size: Normal)   Pulse 83   Temp 97.8 ?F (36.6 ?C) (Temporal)   Resp 18   Wt 155 lb (70.3 kg)   LMP 05/07/2021 (Exact Date)   SpO2 99%   BMI 28.35 kg/m?   ?Wt Readings from Last 3 Encounters:  ?05/23/21 155 lb (70.3 kg)  ?05/04/21 155 lb (70.3 kg)  ?05/02/21 155 lb 9.6 oz (70.6 kg)  ?  ?Physical Exam ?Vitals and nursing note reviewed.  ?Constitutional:   ?   General: She is not in acute distress. ?   Appearance: Normal appearance.  ?HENT:  ?   Head: Normocephalic and atraumatic.  ?   Right Ear: Tympanic membrane, ear canal and external ear normal.  ?   Left Ear: Tympanic membrane, ear canal and external ear normal.  ?   Nose: Nose normal.  ?   Mouth/Throat:  ?   Mouth: Mucous membranes are moist.  ?   Pharynx: Oropharynx is clear.  ?Eyes:  ?   Conjunctiva/sclera: Conjunctivae normal.  ?Cardiovascular:  ?   Rate and Rhythm: Normal rate and regular rhythm.  ?   Pulses: Normal pulses.  ?   Heart sounds: Normal heart sounds.  ?Pulmonary:  ?   Effort: Pulmonary effort is normal.  ?   Breath sounds: Normal breath sounds.  ?Abdominal:  ?   Palpations: Abdomen is soft.  ?   Tenderness: There is no abdominal tenderness.  ?Musculoskeletal:     ?   General: Normal range of motion.  ?   Cervical back: Normal range of motion. No tenderness.  ?   Right lower leg: No edema.  ?   Left lower leg: No edema.  ?Lymphadenopathy:  ?   Cervical: No cervical adenopathy.  ?Skin: ?   General: Skin is warm and dry.  ?Neurological:  ?   General: No focal deficit present.  ?   Mental Status: She is alert and oriented to person, place, and time.  ?   Cranial Nerves: No cranial nerve deficit.  ?   Coordination:  Coordination normal.  ?   Gait: Gait normal.  ?Psychiatric:     ?   Mood and Affect: Mood normal.     ?  Behavior: Behavior normal.     ?   Thought Content: Thought content normal.     ?   Judgment: Judgment normal.  ? ?Diabetic Foot Exam - Simple   ?Simple Foot Form ?Visual Inspection ?No deformities, no ulcerations, no other skin breakdown bilaterally: Yes ?Sensation Testing ?Intact to touch and monofilament testing bilaterally: Yes ?Pulse Check ?Posterior Tibialis and Dorsalis pulse intact bilaterally: Yes ?Comments ?  ? ? ?Results for orders placed or performed in visit on 05/23/21  ?[facility] HIV SCREENING LAB  ?Result Value Ref Range  ? [facility] HIV Screening Negative - Patient reported   ?[facility] HEPATITIS C SCREENING LAB  ?Result Value Ref Range  ? [facility] Hepatitis Screen Negative - Patient Reported   ? ?   ?Assessment & Plan:  ? ?Problem List Items Addressed This Visit   ? ?  ? Endocrine  ? Diabetes mellitus without complication ([place])  ?  Fasting blood sugars at home have ranged from 90s - 120s.  She has been making some changes to her diet and increasing her exercise, which is definitely helping her sugars.  We will have her follow-up in 2 months to recheck her A1c.  Discussed yearly eye exams.  She states that she usually goes once a year in September.  We will continue holding off on medications and evaluate further the next visit. ? ?  ?  ?  ? Other  ? Elevated blood-pressure reading without diagnosis of hypertension  ?  Blood pressure is back down to normal range.  Today it was 116/74.  She has been checking her blood pressure at home and writing it down and it has been within normal limits as well.  Encouraged her to keep checking her blood pressure once or twice a week. ? ?  ?  ? ?Other Visit Diagnoses   ? ? Routine general medical examination at a health care facility    -  Primary  ? Health maintenance reviewed and updated.  CMP, CBC reviewed from March 2023.  She is up-to-date on Pap and mammogram.  Follow-up 1 year   ? Need for tetanus, diphtheria, and acellular pertussis (Tdap) vaccine      ? Tdap updated today  ? Relevant Orders  ? Tdap vaccine greater than or equal to 7yo IM (Completed)  ? Screen for colon cancer      ? Discussed various

## 2021-05-23 NOTE — Patient Instructions (Signed)
It was great to see you! ? ?Keep up the great work with your diet changes and exercise!  ? ?I am placing a referral for your colonoscopy and you should get a phone call to schedule ? ?Let's follow-up in June at your scheduled appointment, sooner if you have concerns. ? ?If a referral was placed today, you will be contacted for an appointment. Please note that routine referrals can sometimes take up to 3-4 weeks to process. Please call our office if you haven't heard anything after this time frame. ? ?Take care, ? ?Rodman Pickle, NP ? ?

## 2021-05-23 NOTE — Assessment & Plan Note (Signed)
Blood pressure is back down to normal range.  Today it was 116/74.  She has been checking her blood pressure at home and writing it down and it has been within normal limits as well.  Encouraged her to keep checking her blood pressure once or twice a week. ?

## 2021-05-23 NOTE — Assessment & Plan Note (Addendum)
Fasting blood sugars at home have ranged from 90s - 120s.  She has been making some changes to her diet and increasing her exercise, which is definitely helping her sugars.  We will have her follow-up in 2 months to recheck her A1c.  Discussed yearly eye exams.  She states that she usually goes once a year in September.  We will continue holding off on medications and evaluate further the next visit. ?

## 2021-06-01 ENCOUNTER — Encounter: Payer: Federal, State, Local not specified - PPO | Attending: Nurse Practitioner | Admitting: Dietician

## 2021-06-01 ENCOUNTER — Encounter: Payer: Self-pay | Admitting: Dietician

## 2021-06-01 VITALS — Ht 61.0 in | Wt 153.7 lb

## 2021-06-01 DIAGNOSIS — E119 Type 2 diabetes mellitus without complications: Secondary | ICD-10-CM | POA: Diagnosis not present

## 2021-06-01 NOTE — Patient Instructions (Signed)
Allow for about 45grams of carbs (mostly healthy, unrefined carbs) with each meal. Consistent intake with each meal helps with consistent blood sugar numbers.  ?Great job making healthy food choices! Keep it up! ?Continue with regular exercise. ? ?

## 2021-06-01 NOTE — Progress Notes (Signed)
Medical Nutrition Therapy: Visit start time: 0820  end time: 0920  ?Assessment:  Diagnosis: Type 2 diabetes ?Past medical history: gestational diabetes ?Psychosocial issues/ stress concerns: none ? ?Preferred learning method:  ?Auditory ?Visual ?Hands-on ? ? ?Current weight: 153.7lbs Height: 5'1" BMI: 29.04 ? ?Medications, supplements: none taken at this time ? ?Progress and evaluation:  ?Patient reports having gestational diabetes with all 3 of her pregnancies. She has been working on guidelines she remembers from previous diabetes education. ?Recent HbA1C of 9.0% (05/02/21) she is working on lifestyle changes for the next 3 months before reassessing BG control.  ?She has been testing fasting BGs daily, lowest has been 96, highest 141 since 04/2021. ? ?Physical activity: walking 30 minutes, 5-6 times per week ? ?Dietary Intake:  ?Usual eating pattern includes 2-3 meals and 1 snacks per day. ?Dining out frequency: 1-2 meals per week. ? ?Breakfast: sometimes low sugar inst oatmeal ?Snack: yogurt (2 Good) ?Lunch: salad with salmon ?Snack: none ?Supper: veg + protein + little carb amt or no carb ?Snack: none; occ almond milk unsweetened vanilla ?Beverages: water 32oz container 1.5 - 2x daily, healthy balance (sugar free) juice drink, oc diet soda ? ?Intervention:  ? ?Nutrition Care Education: ?  ?Basic nutrition: basic food groups; appropriate nutrient balance; appropriate meal and snack schedule; general nutrition guidelines    ?Weight control: importance of low sugar and low fat choices, portion control; estimated energy needs for gradual weight loss at 1400 kcal, provided guidance for 45% CHO, 25% pro, 30% fat ?Advanced nutrition:  food label reading for carbohydrates ?Diabetes:  goals for BGs, appropriate meal and snack schedule, appropriate carb intake and balance, healthy carb choices, role of fiber, protein, fat ? ? ?Nutritional Diagnosis:  [place]-2.2 Altered nutrition-related laboratory As related to diabetes.  As  evidenced by elevated HbA1C. ?-3.3 Overweight/obesity As related to history of excess calories.  As evidenced by patient with current BMI of 29, making diet and lifestyle changes to promote weight loss and improve blood sugar control. ? ? ?Education Materials given:  ?General diet guidelines for Diabetes ?Plate Planner with food lists, sample meal pattern ?Visit summary with goals/ instructions ? ? ?Learner/ who was taught:  ?Patient  ? ? ?Level of understanding: ?Verbalizes/ demonstrates competency ? ? ?Demonstrated degree of understanding via:   Teach back ?Learning barriers: ?None ? ?Willingness to learn/ readiness for change: ?Eager, change in progress ? ? ?Monitoring and Evaluation:  Dietary intake, exercise, BG control, and body weight ?     follow up:  07/06/21 at 8:15am   ?

## 2021-06-01 NOTE — Progress Notes (Deleted)
Medical Nutrition Therapy: Visit start time: 0820  end time: 0920  ?Assessment:  Diagnosis: Type 2 diabetes ?Past medical history: *** ?Psychosocial issues/ stress concerns: *** ? ?Preferred learning method:  ?Auditory ?Visual ?Hands-on ?No preference indicated ? ?Current weight: *** Height: *** BMI: *** ? ?Medications, supplements: *** ? ?Progress and evaluation:  ?Patinet reports having gestational diabetes with all 3 of her pregnancies. She has been working on guidelines she remembers from previous diabetes education. ?Recent HbA1C of 9.0% (05/02/21) she is working on lifestyle changes for the next 3 months before reassessing BG control.  ?She has been testing fasting BGs daily, lowest has been 96, highest 141 since 04/2021. ? ?Physical activity: walking 30 minutes, 5-6 times per week ? ?Dietary Intake:  ?Usual eating pattern includes 2-3 meals and 1 snacks per day. ?Dining out frequency: 1-2 meals per week. ? ?Breakfast: sometimes low sugar inst oatmeal ?Snack: yogurt (2 Good) ?Lunch: salad with salmon ?Snack: none ?Supper: veg + protein + little carb amt or no carb ?Snack: none; occ almond milk unsweetened vanilla ?Beverages: water 32oz container 1.5 - 2x daily, healthy balance (sugar free) juice drink, oc diet soda ? ?Intervention:  ? ?Nutrition Care Education: ?  ?Basic nutrition: basic food groups; appropriate nutrient balance; appropriate meal and snack schedule; general nutrition guidelines    ?Weight control: identifying healthy weight, determining reasonable weight loss rate, importance of low sugar and low fat choices, portion control strategies including ***, estimated energy needs at *** kcal, provided guidance for *** ?Advanced nutrition:  recipe modification, cooking techniques, dining out, food label reading ?Diabetes:  goals for BGs, appropriate meal and snack schedule, appropriate carb intake and balance, healthy carb choices, role of fiber, protein, fat ?Hypertension:  importance of controlling  BP, identifying high sodium foods, identifying food sources of potassium, magnesium ?Hyperlipidemia:  target goals for lipids, healthy and unhealthy fats, role of fiber, plant sterols, role of exercise ?Other lifestyle changes:  benefits of making changes, increasing motivation, readiness for change, identifying habits that need to change, alcohol use, food and drug interactions ? ? ?Nutritional Diagnosis:  {[facility] AMB NUTRITIONAL DIAGNOSIS:[phone]} ? ? ?Education Materials given:  ?General diet guidelines for Diabetes ?General diet guidelines for Cholesterol-lowering/ Heart health ?General diet guidelines for Hypertension ?Bariatric surgery guidelines and diet ?Food record ?Plate Planner with food lists, sample meal pattern ?Food label handout ?Dining out resource ?Planning A Balanced Meal ?Recipes ?Sample menus ?Snacking handout ?Visit summary with goals/ instructions ? ? ?Learner/ who was taught:  ?Patient  ?Spouse/ partner ?Family member: *** ?Caregiver/ guardian ? ?Level of understanding: ?Unable to understand/ needs instruction ?Partial understanding; needs review/ practice ?Verbalizes/ demonstrates competency ?Not applicable ? ?Demonstrated degree of understanding via:   Teach back ?Learning barriers: ?None ?Motivation lacking ?Cognitive limitations ?Communication limitations ?Comprehension ?Hearing impairment ?Language: *** ?Learning disability ?Literacy ?Physical limitations ?Visual impairment ? ?Willingness to learn/ readiness for change: ?Eager, change in progress ?Acceptance, ready for change ?Hesitance, contemplating change ?Non-acceptance, not ready for change ? ?Monitoring and Evaluation:  Dietary intake, exercise, ***, and body weight ?     follow up: {follow up:15908}  ?

## 2021-06-02 ENCOUNTER — Ambulatory Visit: Payer: Self-pay | Admitting: Gastroenterology

## 2021-07-06 ENCOUNTER — Ambulatory Visit: Payer: Federal, State, Local not specified - PPO | Admitting: Dietician

## 2021-08-02 ENCOUNTER — Encounter: Payer: Self-pay | Admitting: Nurse Practitioner

## 2021-08-02 ENCOUNTER — Ambulatory Visit: Payer: Federal, State, Local not specified - PPO | Admitting: Nurse Practitioner

## 2021-08-02 VITALS — BP 128/83 | HR 79 | Temp 97.7°F | Wt 154.0 lb

## 2021-08-02 DIAGNOSIS — E119 Type 2 diabetes mellitus without complications: Secondary | ICD-10-CM

## 2021-08-02 LAB — POCT GLYCOSYLATED HEMOGLOBIN (HGB A1C)
HbA1c POC (<> result, manual entry): 6.9 % (ref 4.0–5.6)
HbA1c, POC (controlled diabetic range): 6.9 % (ref 0.0–7.0)
HbA1c, POC (prediabetic range): 6.9 % — AB (ref 5.7–6.4)
Hemoglobin A1C: 6.9 % — AB (ref 4.0–5.6)

## 2021-08-02 LAB — MICROALBUMIN / CREATININE URINE RATIO
Creatinine,U: 158.3 mg/dL
Microalb Creat Ratio: 0.8 mg/g (ref 0.0–30.0)
Microalb, Ur: 1.3 mg/dL (ref 0.0–1.9)

## 2021-08-02 NOTE — Assessment & Plan Note (Signed)
Her sugars have improved to 6.9%.  Congratulated her on this as this was with diet and exercise alone.  Continue nutritional changes.  She can continue checking her fasting blood sugars 2-3 times per week.  Foot exam normal today.  She is due for her eye exam in October 2023.  We will request prior records from [facility].  We discussed possibly starting a statin to help protect her heart and brain with diabetes.  Her LDL is 104 and her ASCVD is only 3.7%.  She would like to think about this, which is understandable as her numbers are well controlled.  We will check urine microalbumin today.  Follow-up in 6 months.

## 2021-08-05 ENCOUNTER — Encounter: Payer: Self-pay | Admitting: Dietician

## 2021-08-05 NOTE — Progress Notes (Signed)
Have not heard back from patient to reschedule her cancelled appointment from 06/26/21. Sent notification to referring provider.

## 2021-10-13 DIAGNOSIS — N3001 Acute cystitis with hematuria: Secondary | ICD-10-CM | POA: Diagnosis not present

## 2021-10-13 DIAGNOSIS — R309 Painful micturition, unspecified: Secondary | ICD-10-CM | POA: Diagnosis not present

## 2021-11-19 DIAGNOSIS — M47816 Spondylosis without myelopathy or radiculopathy, lumbar region: Secondary | ICD-10-CM | POA: Insufficient documentation

## 2021-11-19 DIAGNOSIS — M4316 Spondylolisthesis, lumbar region: Secondary | ICD-10-CM | POA: Insufficient documentation

## 2021-11-19 DIAGNOSIS — S39012A Strain of muscle, fascia and tendon of lower back, initial encounter: Secondary | ICD-10-CM | POA: Insufficient documentation

## 2021-11-19 DIAGNOSIS — M545 Low back pain, unspecified: Secondary | ICD-10-CM | POA: Diagnosis not present

## 2021-11-19 DIAGNOSIS — M533 Sacrococcygeal disorders, not elsewhere classified: Secondary | ICD-10-CM | POA: Insufficient documentation

## 2021-11-19 DIAGNOSIS — M5416 Radiculopathy, lumbar region: Secondary | ICD-10-CM | POA: Insufficient documentation

## 2022-01-11 ENCOUNTER — Encounter: Payer: Self-pay | Admitting: Obstetrics and Gynecology

## 2022-01-11 DIAGNOSIS — R102 Pelvic and perineal pain: Secondary | ICD-10-CM

## 2022-01-13 ENCOUNTER — Ambulatory Visit: Payer: Federal, State, Local not specified - PPO | Admitting: Nurse Practitioner

## 2022-02-03 ENCOUNTER — Ambulatory Visit (INDEPENDENT_AMBULATORY_CARE_PROVIDER_SITE_OTHER): Payer: Federal, State, Local not specified - PPO

## 2022-02-03 DIAGNOSIS — R102 Pelvic and perineal pain: Secondary | ICD-10-CM | POA: Diagnosis not present

## 2022-02-05 ENCOUNTER — Encounter: Payer: Self-pay | Admitting: Obstetrics and Gynecology

## 2022-03-03 NOTE — Progress Notes (Unsigned)
   Established Patient Office Visit  Subjective   Patient ID: Courtney Koch, female    DOB: [birth year]  Age: 47 y.o. MRN: [id number]  No chief complaint on file.   HPI  Courtney Koch is here to follow-up on diabetes.   DIABETES  Hypoglycemic episodes:{Blank single:19197::"yes","no"} Polydipsia/polyuria: {Blank single:19197::"yes","no"} Visual disturbance: {Blank single:19197::"yes","no"} Chest pain: {Blank single:19197::"yes","no"} Paresthesias: {Blank single:19197::"yes","no"} Glucose Monitoring: {Blank single:19197::"yes","no"}  Accucheck frequency: {Blank single:19197::"Not Checking","Daily","BID","TID"}  Fasting glucose:  Post prandial:  Evening:  Before meals: Taking Insulin?: no Blood Pressure Monitoring: {Blank single:19197::"not checking","rarely","daily","weekly","monthly","a few times a day","a few times a week","a few times a month"} Retinal Examination: {Blank single:19197::"Up to Date","Not up to Date"} Foot Exam: {Blank single:19197::"Up to Date","Not up to Date"} Diabetic Education: {Blank single:19197::"Completed","Not Completed"} Pneumovax: {Blank single:19197::"Up to Date","Not up to Date","unknown"} Influenza: {Blank single:19197::"Up to Date","Not up to Date","unknown"} Aspirin: {Blank single:19197::"yes","no"}   {History (Optional):23778}  ROS    Objective:     There were no vitals taken for this visit. {Vitals History (Optional):23777}  Physical Exam   No results found for any visits on 03/06/22.  {Labs (Optional):23779}  The 10-year ASCVD risk score (Arnett DK, et al., 2019) is: 4.2%    Assessment & Plan:   Problem List Items Addressed This Visit   None   No follow-ups on file.    Charyl Dancer, NP

## 2022-03-06 ENCOUNTER — Encounter: Payer: Self-pay | Admitting: Nurse Practitioner

## 2022-03-06 ENCOUNTER — Ambulatory Visit: Payer: Federal, State, Local not specified - PPO | Admitting: Nurse Practitioner

## 2022-03-06 VITALS — BP 124/80 | HR 78 | Temp 96.7°F | Ht 61.0 in | Wt 152.0 lb

## 2022-03-06 DIAGNOSIS — E119 Type 2 diabetes mellitus without complications: Secondary | ICD-10-CM | POA: Diagnosis not present

## 2022-03-06 DIAGNOSIS — K219 Gastro-esophageal reflux disease without esophagitis: Secondary | ICD-10-CM | POA: Diagnosis not present

## 2022-03-06 LAB — POCT GLYCOSYLATED HEMOGLOBIN (HGB A1C): Hemoglobin A1C: 6.8 % — AB (ref 4.0–5.6)

## 2022-03-06 NOTE — Patient Instructions (Signed)
It was great to see you!  You can call to schedule appointment for colonoscopy:   [facility]: Address: [address], [place], [place] [address] Phone: [phone]  You can start an omega 3 fish oil to help with your cholesterol. Exercising can help as well.   Keep taking the omeprazole daily. You can take tums or peptobismol as needed for active reflux symptoms.   Let's follow-up in 6 months, sooner if you have concerns.  If a referral was placed today, you will be contacted for an appointment. Please note that routine referrals can sometimes take up to 3-4 weeks to process. Please call our office if you haven't heard anything after this time frame.  Take care,  Vance Peper, NP

## 2022-03-06 NOTE — Assessment & Plan Note (Signed)
Chronic, ongoing.  She is having intermittent flareups of symptoms.  She can continue to take omeprazole 20 mg daily and then take Tums or Pepto-Bismol as needed for acute symptoms.  Follow-up in 6 months or sooner with concerns.

## 2022-03-06 NOTE — Assessment & Plan Note (Signed)
Chronic, stable. She is controlling her diabetes with lifestyle changes.  A1c today is 6.8%.  Continue nutrition and exercise changes.  She declines flu and pneumonia vaccine today.  Information given on pneumonia vaccine.  Follow-up in 6 months.

## 2022-03-31 ENCOUNTER — Encounter: Payer: Self-pay | Admitting: Nurse Practitioner

## 2022-03-31 NOTE — Telephone Encounter (Signed)
Pt states she can wait until next week for an appt, however due to the distance from the office, she is opting to go to urgent care for treatment.

## 2022-05-16 ENCOUNTER — Ambulatory Visit: Payer: Federal, State, Local not specified - PPO | Admitting: Gastroenterology

## 2022-05-16 ENCOUNTER — Telehealth: Payer: Self-pay | Admitting: Gastroenterology

## 2022-05-16 NOTE — Telephone Encounter (Signed)
Left vm to cancel appointment for today at 3:15 with Dr. Tobi Bastos

## 2022-07-13 ENCOUNTER — Other Ambulatory Visit
Admission: RE | Admit: 2022-07-13 | Discharge: 2022-07-13 | Disposition: A | Discharge status: Home / Self Care | Payer: Federal, State, Local not specified - PPO | Source: Ambulatory Visit | Attending: Ophthalmology | Admitting: Ophthalmology

## 2022-07-13 DIAGNOSIS — H18832 Recurrent erosion of cornea, left eye: Secondary | ICD-10-CM | POA: Diagnosis not present

## 2022-07-13 DIAGNOSIS — H16002 Unspecified corneal ulcer, left eye: Secondary | ICD-10-CM | POA: Insufficient documentation

## 2022-07-13 DIAGNOSIS — H209 Unspecified iridocyclitis: Secondary | ICD-10-CM | POA: Diagnosis not present

## 2022-07-14 DIAGNOSIS — H16002 Unspecified corneal ulcer, left eye: Secondary | ICD-10-CM | POA: Diagnosis not present

## 2022-07-14 DIAGNOSIS — H209 Unspecified iridocyclitis: Secondary | ICD-10-CM | POA: Diagnosis not present

## 2022-07-15 DIAGNOSIS — H16002 Unspecified corneal ulcer, left eye: Secondary | ICD-10-CM | POA: Diagnosis not present

## 2022-07-15 LAB — EYE CULTURE W GRAM STAIN

## 2022-07-16 DIAGNOSIS — H16002 Unspecified corneal ulcer, left eye: Secondary | ICD-10-CM | POA: Diagnosis not present

## 2022-07-17 DIAGNOSIS — H16002 Unspecified corneal ulcer, left eye: Secondary | ICD-10-CM | POA: Diagnosis not present

## 2022-07-17 DIAGNOSIS — H209 Unspecified iridocyclitis: Secondary | ICD-10-CM | POA: Diagnosis not present

## 2022-07-18 DIAGNOSIS — H16002 Unspecified corneal ulcer, left eye: Secondary | ICD-10-CM | POA: Diagnosis not present

## 2022-07-18 DIAGNOSIS — H209 Unspecified iridocyclitis: Secondary | ICD-10-CM | POA: Diagnosis not present

## 2022-07-20 DIAGNOSIS — H209 Unspecified iridocyclitis: Secondary | ICD-10-CM | POA: Diagnosis not present

## 2022-07-20 DIAGNOSIS — H16002 Unspecified corneal ulcer, left eye: Secondary | ICD-10-CM | POA: Diagnosis not present

## 2022-07-24 DIAGNOSIS — H16002 Unspecified corneal ulcer, left eye: Secondary | ICD-10-CM | POA: Diagnosis not present

## 2022-07-28 DIAGNOSIS — H16002 Unspecified corneal ulcer, left eye: Secondary | ICD-10-CM | POA: Diagnosis not present

## 2022-08-03 DIAGNOSIS — H16002 Unspecified corneal ulcer, left eye: Secondary | ICD-10-CM | POA: Diagnosis not present

## 2022-08-03 LAB — CULTURE, FUNGUS WITHOUT SMEAR

## 2022-08-14 DIAGNOSIS — H16002 Unspecified corneal ulcer, left eye: Secondary | ICD-10-CM | POA: Diagnosis not present

## 2022-09-04 ENCOUNTER — Encounter: Payer: Federal, State, Local not specified - PPO | Admitting: Nurse Practitioner

## 2022-09-11 ENCOUNTER — Encounter: Payer: Federal, State, Local not specified - PPO | Admitting: Nurse Practitioner

## 2022-09-25 DIAGNOSIS — H16002 Unspecified corneal ulcer, left eye: Secondary | ICD-10-CM | POA: Diagnosis not present

## 2022-09-25 DIAGNOSIS — H179 Unspecified corneal scar and opacity: Secondary | ICD-10-CM | POA: Diagnosis not present

## 2022-09-26 ENCOUNTER — Encounter: Payer: Federal, State, Local not specified - PPO | Admitting: Nurse Practitioner

## 2022-10-30 DIAGNOSIS — H16002 Unspecified corneal ulcer, left eye: Secondary | ICD-10-CM | POA: Diagnosis not present

## 2022-12-05 DIAGNOSIS — H5213 Myopia, bilateral: Secondary | ICD-10-CM | POA: Diagnosis not present

## 2022-12-05 DIAGNOSIS — H179 Unspecified corneal scar and opacity: Secondary | ICD-10-CM | POA: Diagnosis not present

## 2022-12-05 DIAGNOSIS — Z0100 Encounter for examination of eyes and vision without abnormal findings: Secondary | ICD-10-CM | POA: Diagnosis not present

## 2022-12-05 DIAGNOSIS — H18513 Endothelial corneal dystrophy, bilateral: Secondary | ICD-10-CM | POA: Diagnosis not present

## 2023-02-24 ENCOUNTER — Other Ambulatory Visit: Payer: Self-pay | Admitting: Nurse Practitioner

## 2023-02-27 NOTE — Telephone Encounter (Signed)
Requesting: ONE TOUCH ULTRA BLUE TESTST(NEW)100  Last Visit: 03/06/2022 Next Visit: Visit date not found Last Refill: 05/03/2021  Please Advise

## 2023-03-03 IMAGING — MG MM DIGITAL SCREENING BILAT W/ TOMO AND CAD
8 series · 8 of 24 positions shown · non-contrast
Comparison: None.

CLINICAL DATA: Screening. This is the patient's initial baseline
mammogram.

EXAM:
DIGITAL SCREENING BILATERAL MAMMOGRAM WITH TOMOSYNTHESIS AND CAD
TECHNIQUE: Bilateral screening digital craniocaudal and mediolateral oblique
mammograms were obtained. Bilateral screening digital breast
tomosynthesis was performed. The images were evaluated with
computer-aided detection.

[R MLO synth-2D]
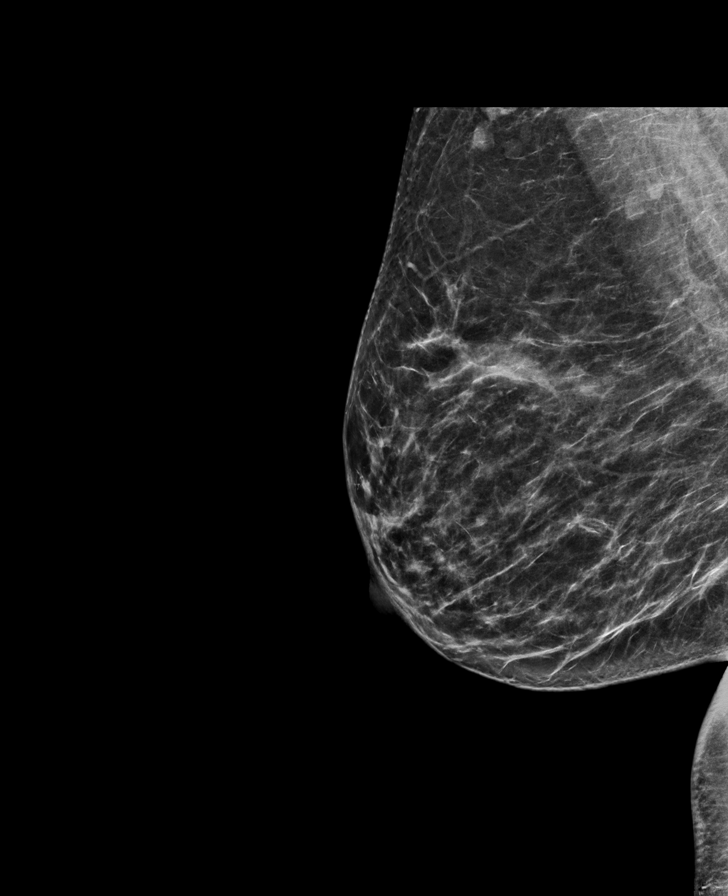

[L CC synth-2D]
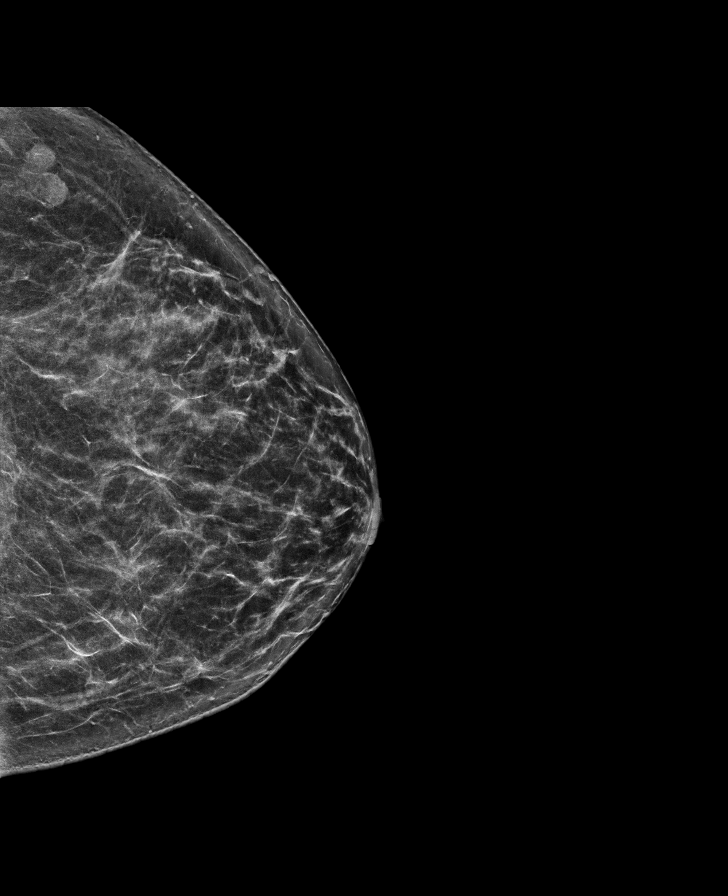

[L MLO synth-2D]
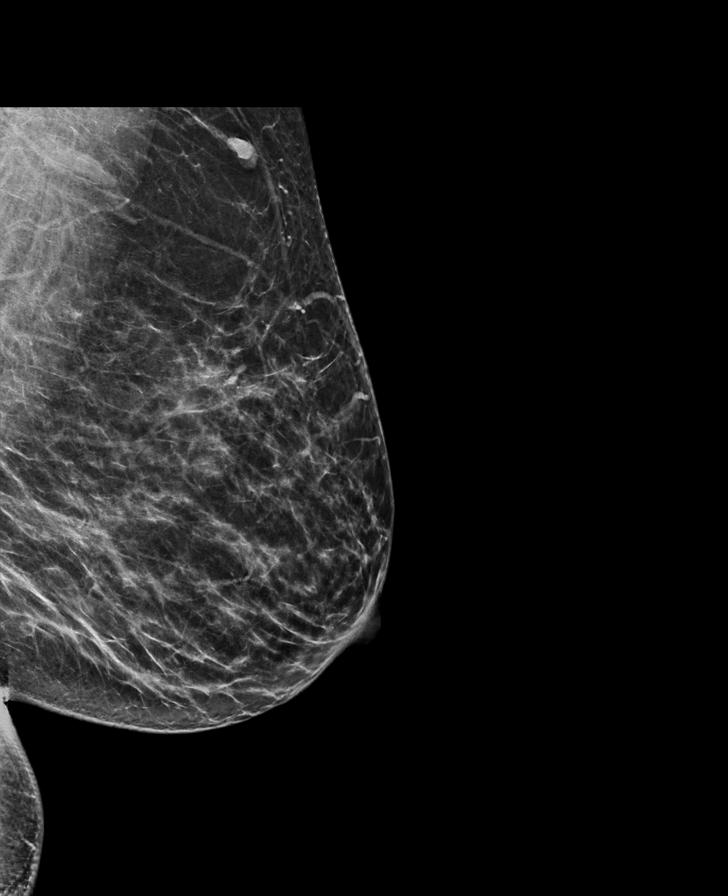

[R CC synth-2D]
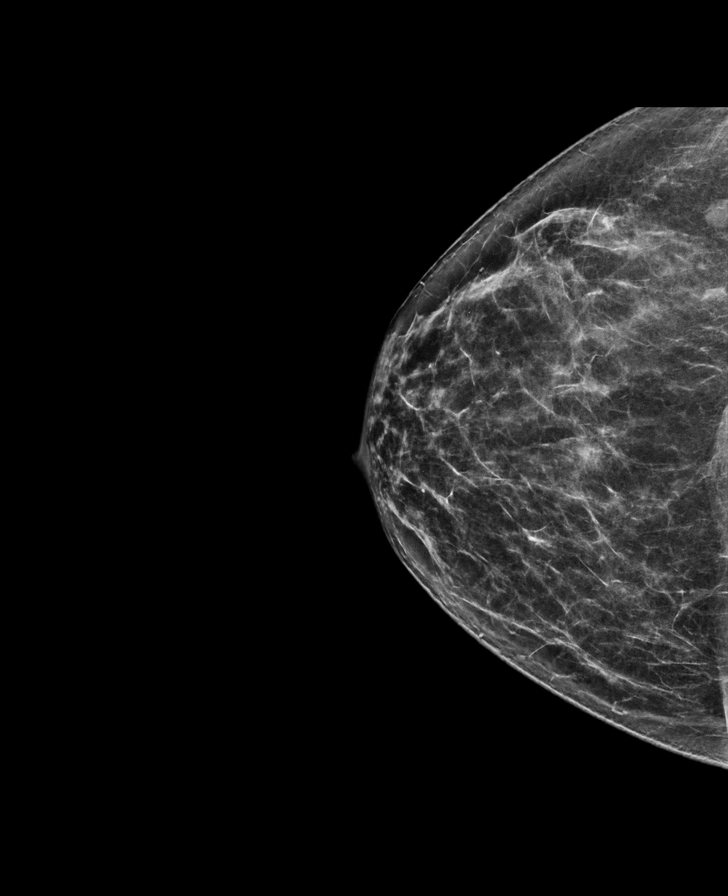

[R CC tomo · tomo slice 36/71.0]
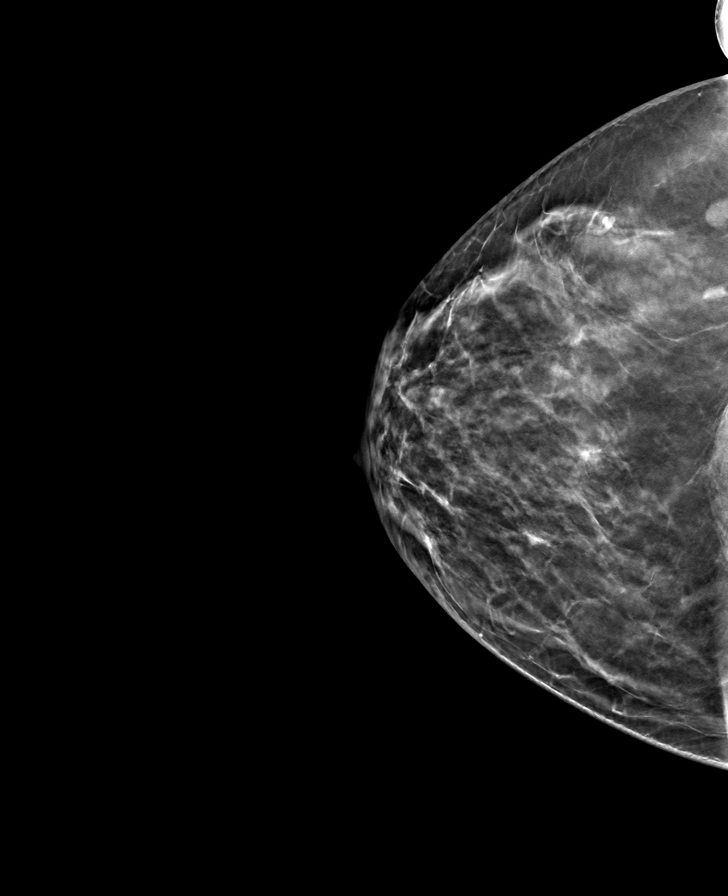

[R MLO tomo · tomo slice 37/74.0]
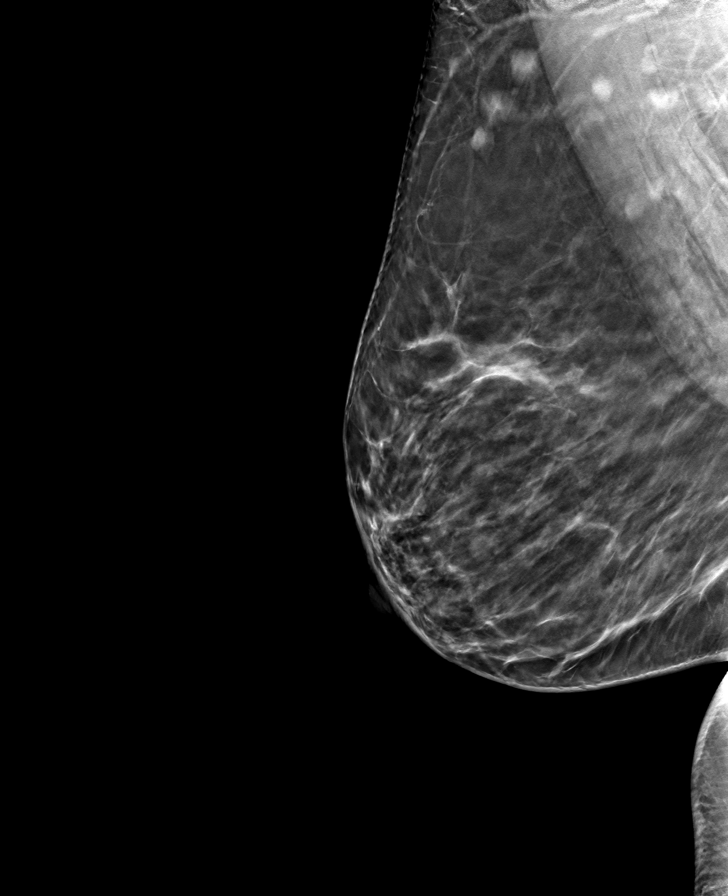

[L CC tomo · tomo slice 35/69.0]
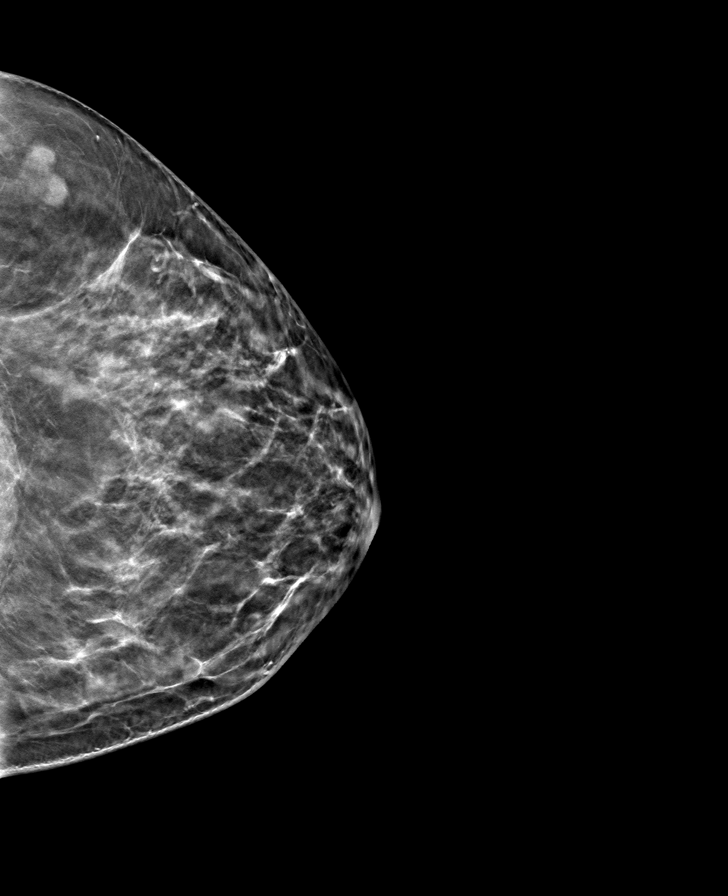

[L MLO tomo · tomo slice 37/73.0]
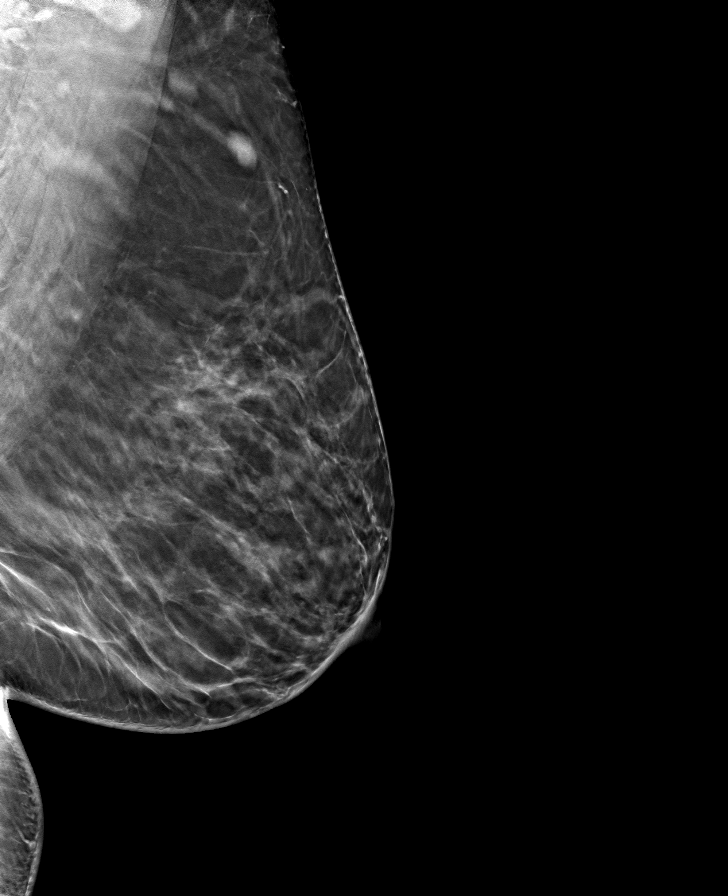

[8 of 24 positions shown; findings below may reference images not displayed]

ACR Breast Density Category b: There are scattered areas of
fibroglandular density.
FINDINGS: There are no findings suspicious for malignancy.
IMPRESSION: No mammographic evidence of malignancy. A result letter of this
screening mammogram will be mailed directly to the patient.

RECOMMENDATION:
Screening mammogram in one year. (Code:91-A-9VU)

BI-RADS CATEGORY  1: Negative.

## 2023-06-05 ENCOUNTER — Emergency Department
Admission: EM | Admit: 2023-06-05 | Discharge: 2023-06-05 | Disposition: A | Discharge status: Home / Self Care | Attending: Emergency Medicine | Admitting: Emergency Medicine

## 2023-06-05 ENCOUNTER — Other Ambulatory Visit: Payer: Self-pay

## 2023-06-05 DIAGNOSIS — H8110 Benign paroxysmal vertigo, unspecified ear: Secondary | ICD-10-CM

## 2023-06-05 DIAGNOSIS — E119 Type 2 diabetes mellitus without complications: Secondary | ICD-10-CM | POA: Diagnosis not present

## 2023-06-05 DIAGNOSIS — R42 Dizziness and giddiness: Secondary | ICD-10-CM | POA: Diagnosis not present

## 2023-06-05 MED ORDER — MECLIZINE HCL 25 MG PO TABS: 25.0000 mg | ORAL_TABLET | Freq: Three times a day (TID) | ORAL | 0 refills | Status: AC | PRN

## 2023-06-05 NOTE — ED Triage Notes (Signed)
 Pt to ED for dizziness since 1 week, from [facility]. Says sometimes she's unsur eif walking straight. Pt walks with steady gait. Denies vision changes, unilateral weakness, or severe HA. Speech is clear.

## 2023-06-05 NOTE — Discharge Instructions (Addendum)
 I believe your symptoms are caused by benign paroxysmal positional vertigo also known as BPPV.  This is treated with a medication called meclizine.  This can be taken 3 times daily as needed for dizziness.  I recommend that you follow-up with ENT if this does not resolve on its own with the medication.  Their information is attached.

## 2023-06-05 NOTE — ED Triage Notes (Signed)
 First Nurse Note: Patient to ED from [facility] for intermittent dizziness x1 week. States she is having to focus more to say her words correctly also for 1 week.

## 2023-06-05 NOTE — ED Provider Notes (Signed)
 [facility] Provider Note    Event Date/Time   First MD Initiated Contact with Patient 06/05/23 1811     (approximate)   History   Dizziness   HPI  Courtney Koch is a 48 y.o. female with PMH of diabetes who presents for evaluation of dizziness.  Patient states dizziness has been bothering her for about a week.  She reports that it is intermittent and lasts for less than a minute each time.  She describes her dizziness as the room spinning.  She has not found her to be associated with positional changes.  She denies vision changes, unilateral weakness, headache, hearing changes and hearing loss.  No fevers or recent upper respiratory infections.      Physical Exam   Triage Vital Signs: ED Triage Vitals  Encounter Vitals Group     BP 06/05/23 1721 (!) 149/91     Systolic BP Percentile --      Diastolic BP Percentile --      Pulse Rate 06/05/23 1721 77     Resp 06/05/23 1721 20     Temp 06/05/23 1721 98.6 F (37 C)     Temp Source 06/05/23 1721 Oral     SpO2 06/05/23 1721 100 %     Weight 06/05/23 1722 151 lb (68.5 kg)     Height 06/05/23 1722 5\' 1"  (1.549 m)     Head Circumference --      Peak Flow --      Pain Score 06/05/23 1719 0     Pain Loc --      Pain Education --      Exclude from Growth Chart --     Most recent vital signs: Vitals:   06/05/23 1721  BP: (!) 149/91  Pulse: 77  Resp: 20  Temp: 98.6 F (37 C)  SpO2: 100%   General: Awake, no distress.  CV:  Good peripheral perfusion.  RRR. Resp:  Normal effort.  CTAB. Abd:  No distention.  Other:  Bilateral EACs are clear, bilateral TMs are translucent.  No focal neurodeficits.  PERRL.  EOM are intact.  No ataxia.  Patient able to walk, toe walk, heel walk and heel toe walk without difficulty.   ED Results / Procedures / Treatments   Labs (all labs ordered are listed, but only abnormal results are displayed) Labs Reviewed - No data to display  PROCEDURES:  Critical  Care performed: No  Procedures   MEDICATIONS ORDERED IN ED: Medications - No data to display   IMPRESSION / MDM / ASSESSMENT AND PLAN / ED COURSE  I reviewed the triage vital signs and the nursing notes.                             48 year old female presents for evaluation of dizziness.  Blood pressure is elevated otherwise vital signs are stable.  Patient NAD on exam.  Differential diagnosis includes, but is not limited to, BPPV, TIA, Mnire disease, acoustic neuroma, vestibular neuronitis, labyrinthitis..  Patient's presentation is most consistent with acute presentation with potential threat to life or bodily function.  Patient was not presenting with any CNS signs or symptoms as a cause of vertigo.  The vertigo is intermittent.  She did not have any unilateral weakness, vision changes, facial droop or other focal neurodeficits.  Did not feel that CT scan was warranted at this time.  She denied hearing loss and changes in hearing  lowering my suspicion for vestibular neuronitis or Mnire's disease and acoustic neuroma.  She has not had any recent URI or ear infections decreasing my suspicion for labyrinthitis.  Although patient has not found her symptoms to be precipitated by rapid head movements, I do believe her symptoms are most likely caused by BPPV. I will start her on meclizine and recommended she follow-up with ENT.  Patient voiced understanding, all questions were answered and she was stable at discharge.       FINAL CLINICAL IMPRESSION(S) / ED DIAGNOSES   Final diagnoses:  Benign paroxysmal positional vertigo, unspecified laterality     Rx / DC Orders   ED Discharge Orders          Ordered    meclizine (ANTIVERT) 25 MG tablet  3 times daily PRN        06/05/23 2025             Note:  This document was prepared using Dragon voice recognition software and may include unintentional dictation errors.   Phyliss Breen, PA-C 06/05/23 2027    Shane Darling, MD 06/05/23 2052

## 2023-06-19 DIAGNOSIS — H18513 Endothelial corneal dystrophy, bilateral: Secondary | ICD-10-CM | POA: Diagnosis not present

## 2023-06-19 DIAGNOSIS — H179 Unspecified corneal scar and opacity: Secondary | ICD-10-CM | POA: Diagnosis not present

## 2023-06-19 DIAGNOSIS — E119 Type 2 diabetes mellitus without complications: Secondary | ICD-10-CM | POA: Diagnosis not present

## 2023-06-19 LAB — HM DIABETES EYE EXAM

## 2023-08-24 ENCOUNTER — Other Ambulatory Visit: Payer: Self-pay | Admitting: Nurse Practitioner

## 2023-08-24 NOTE — Telephone Encounter (Signed)
 Requesting: ONE TOUCH ULTRA BLUE TESTST(NEW)100  Last Visit: 03/06/2022 Next Visit: Visit date not found Last Refill: 02/27/2023  Please Advise

## 2023-11-13 ENCOUNTER — Other Ambulatory Visit: Payer: Self-pay | Admitting: Nurse Practitioner

## 2023-11-13 DIAGNOSIS — Z1231 Encounter for screening mammogram for malignant neoplasm of breast: Secondary | ICD-10-CM

## 2023-11-14 ENCOUNTER — Encounter: Admitting: Nurse Practitioner

## 2023-11-20 ENCOUNTER — Ambulatory Visit
Admission: RE | Admit: 2023-11-20 | Discharge: 2023-11-20 | Disposition: A | Discharge status: Home / Self Care | Source: Ambulatory Visit

## 2023-11-20 DIAGNOSIS — Z1231 Encounter for screening mammogram for malignant neoplasm of breast: Secondary | ICD-10-CM | POA: Diagnosis not present

## 2023-11-22 ENCOUNTER — Ambulatory Visit: Payer: Self-pay | Admitting: Nurse Practitioner

## 2023-11-23 ENCOUNTER — Encounter: Payer: Self-pay | Admitting: Nurse Practitioner

## 2023-11-23 ENCOUNTER — Ambulatory Visit: Payer: Self-pay | Admitting: Nurse Practitioner

## 2023-11-23 ENCOUNTER — Ambulatory Visit (INDEPENDENT_AMBULATORY_CARE_PROVIDER_SITE_OTHER): Admitting: Nurse Practitioner

## 2023-11-23 VITALS — BP 118/80 | HR 64 | Temp 97.2°F | Ht 61.0 in | Wt 152.2 lb

## 2023-11-23 DIAGNOSIS — E119 Type 2 diabetes mellitus without complications: Secondary | ICD-10-CM

## 2023-11-23 DIAGNOSIS — Z0000 Encounter for general adult medical examination without abnormal findings: Secondary | ICD-10-CM

## 2023-11-23 DIAGNOSIS — Z23 Encounter for immunization: Secondary | ICD-10-CM | POA: Diagnosis not present

## 2023-11-23 LAB — LIPID PANEL
Cholesterol: 180 mg/dL (ref 0–200)
HDL: 51.1 mg/dL (ref >39.00)
LDL Cholesterol: 116 mg/dL — ABNORMAL HIGH (ref 0–99)
NonHDL: 128.48
Total CHOL/HDL Ratio: 4
Triglycerides: 64 mg/dL (ref 0.0–149.0)
VLDL: 12.8 mg/dL (ref 0.0–40.0)

## 2023-11-23 LAB — CBC WITH DIFFERENTIAL/PLATELET
Basophils Absolute: 0 K/uL (ref 0.0–0.1)
Basophils Relative: 0.6 % (ref 0.0–3.0)
Eosinophils Absolute: 0.1 K/uL (ref 0.0–0.7)
Eosinophils Relative: 1.6 % (ref 0.0–5.0)
HCT: 38.2 % (ref 36.0–46.0)
Hemoglobin: 12.5 g/dL (ref 12.0–15.0)
Lymphocytes Relative: 33 % (ref 12.0–46.0)
Lymphs Abs: 1.6 K/uL (ref 0.7–4.0)
MCHC: 32.7 g/dL (ref 30.0–36.0)
MCV: 78.2 fl (ref 78.0–100.0)
Monocytes Absolute: 0.3 K/uL (ref 0.1–1.0)
Monocytes Relative: 6.9 % (ref 3.0–12.0)
Neutro Abs: 2.8 K/uL (ref 1.4–7.7)
Neutrophils Relative %: 57.9 % (ref 43.0–77.0)
Platelets: 279 K/uL (ref 150.0–400.0)
RBC: 4.89 Mil/uL (ref 3.87–5.11)
RDW: 14.3 % (ref 11.5–15.5)
WBC: 4.8 K/uL (ref 4.0–10.5)

## 2023-11-23 LAB — COMPREHENSIVE METABOLIC PANEL WITH GFR
ALT: 17 U/L (ref 0–35)
AST: 21 U/L (ref 0–37)
Albumin: 4.7 g/dL (ref 3.5–5.2)
Alkaline Phosphatase: 105 U/L (ref 39–117)
BUN: 9 mg/dL (ref 6–23)
CO2: 29 meq/L (ref 19–32)
Calcium: 9.9 mg/dL (ref 8.4–10.5)
Chloride: 101 meq/L (ref 96–112)
Creatinine, Ser: 0.69 mg/dL (ref 0.40–1.20)
GFR: 101.12 mL/min (ref >60.00)
Glucose, Bld: 157 mg/dL — ABNORMAL HIGH (ref 70–99)
Potassium: 3.9 meq/L (ref 3.5–5.1)
Sodium: 140 meq/L (ref 135–145)
Total Bilirubin: 0.7 mg/dL (ref 0.2–1.2)
Total Protein: 8.5 g/dL — ABNORMAL HIGH (ref 6.0–8.3)

## 2023-11-23 LAB — HEMOGLOBIN A1C: Hgb A1c MFr Bld: 9.3 % — ABNORMAL HIGH (ref 4.6–6.5)

## 2023-11-23 LAB — MICROALBUMIN / CREATININE URINE RATIO
Creatinine,U: 148.8 mg/dL
Microalb Creat Ratio: 6.5 mg/g (ref 0.0–30.0)
Microalb, Ur: 1 mg/dL (ref 0.0–1.9)

## 2023-11-23 MED ORDER — ROSUVASTATIN CALCIUM 5 MG PO TABS: 5.0000 mg | ORAL_TABLET | Freq: Every day | ORAL | 0 refills | Status: AC

## 2023-11-23 MED ORDER — METFORMIN HCL ER 500 MG PO TB24: 500.0000 mg | ORAL_TABLET | Freq: Two times a day (BID) | ORAL | 0 refills | Status: AC

## 2023-11-23 NOTE — Progress Notes (Unsigned)
 BP 118/80 (BP Location: Left Arm, Patient Position: Sitting, Cuff Size: Normal)   Pulse 64   Temp (!) 97.2 F (36.2 C)   Ht 5' 1 (1.549 m)   Wt 152 lb 3.2 oz (69 kg)   SpO2 98%   BMI 28.76 kg/m    Subjective:    Patient ID: Courtney Koch, female    DOB: 1975-03-20, 48 y.o.   MRN: [id number]  CC: Chief Complaint  Patient presents with   Annual Exam    With fasting labs, no concerns    HPI: Courtney Koch is a 48 y.o. female presenting on 11/23/2023 for comprehensive medical examination. Current medical complaints include:{Blank single:19197::none,***}  She currently lives with: Menopausal Symptoms: {Blank single:19197::yes,no}  Depression and Anxiety Screen done today and results listed below:     11/23/2023    9:01 AM 06/01/2021    8:30 AM 05/23/2021   11:10 AM 04/22/2021   11:14 AM  Depression screen PHQ 2/9  Decreased Interest 0 0 0   Down, Depressed, Hopeless 0 0 0 0  PHQ - 2 Score 0 0 0 0  Altered sleeping 0  1 0  Tired, decreased energy 0  0 0  Change in appetite 1  0 0  Feeling bad or failure about yourself  0  0 0  Trouble concentrating 0  0 0  Moving slowly or fidgety/restless 0  0 0  Suicidal thoughts 0  0 0  PHQ-9 Score 1  1 0  Difficult doing work/chores Somewhat difficult  Not difficult at all       11/23/2023    9:01 AM 05/23/2021   11:11 AM 04/22/2021   11:14 AM  GAD 7 : Generalized Anxiety Score  Nervous, Anxious, on Edge 0 0 0  Control/stop worrying 0 0 0  Worry too much - different things 0 0 0  Trouble relaxing 0 0 1  Restless 0 0 0  Easily annoyed or irritable 2 0 0  Afraid - awful might happen 0 0 0  Total GAD 7 Score 2 0 1  Anxiety Difficulty Somewhat difficult Not difficult at all     The patient {has/does not have:19849} a history of falls. I {did/did not:19850} complete a risk assessment for falls. A plan of care for falls {was/was not:19852} documented.   Past Medical History:  Past Medical History:  Diagnosis Date    Allergy 06/2000   Gestational diabetes    and type 2 DM    Surgical History:  Past Surgical History:  Procedure Laterality Date   CESAREAN SECTION  06/2000, 06/2002, 12/2007    Medications:  Current Outpatient Medications on File Prior to Visit  Medication Sig   Blood Glucose Monitoring Suppl (ONE TOUCH ULTRA 2) w/Device KIT 1 each by Does not apply route daily.   glucose blood (ONETOUCH ULTRA) test strip USE TO TEST ONCE DAILY AS DIRECTED   cyclobenzaprine (FLEXERIL) 5 MG tablet Take ONE tab PO BID/TID PRN (Patient not taking: Reported on 11/23/2023)   meclizine  (ANTIVERT ) 25 MG tablet Take 1 tablet (25 mg total) by mouth 3 (three) times daily as needed for dizziness. (Patient not taking: Reported on 11/23/2023)   No current facility-administered medications on file prior to visit.    Allergies:  Allergies  Allergen Reactions   Oxycodone-Acetaminophen Anaphylaxis, Swelling and Rash    Social History:  Social History   Socioeconomic History   Marital status: Married    Spouse name: Not on file   Number of  children: Not on file   Years of education: Not on file   Highest education level: Not on file  Occupational History   Not on file  Tobacco Use   Smoking status: Never   Smokeless tobacco: Never  Vaping Use   Vaping status: Never Used  Substance and Sexual Activity   Alcohol use: Yes    Alcohol/week: 1.0 standard drink of alcohol    Types: 1 Glasses of wine per week    Comment: Occasionally when we go out.  One drink every 4 or so mths.   Drug use: Never   Sexual activity: Yes    Birth control/protection: None  Other Topics Concern   Not on file  Social History Narrative   Not on file   Social Drivers of Health   Financial Resource Strain: Low Risk  (06/05/2023)   Received from [facility]   Overall Financial Resource Strain (CARDIA)    Difficulty of Paying Living Expenses: Not hard at all  Food Insecurity: No Food Insecurity  (06/05/2023)   Received from [facility]   Hunger Vital Sign    Within the past 12 months, you worried that your food would run out before you got the money to buy more.: Never true    Within the past 12 months, the food you bought just didn't last and you didn't have money to get more.: Never true  Transportation Needs: No Transportation Needs (06/05/2023)   Received from [facility] - Transportation    In the past 12 months, has lack of transportation kept you from medical appointments or from getting medications?: No    Lack of Transportation (Non-Medical): No  Physical Activity: Not on file  Stress: Not on file  Social Connections: Not on file  Intimate Partner Violence: Not on file   Social History   Tobacco Use  Smoking Status Never  Smokeless Tobacco Never   Social History   Substance and Sexual Activity  Alcohol Use Yes   Alcohol/week: 1.0 standard drink of alcohol   Types: 1 Glasses of wine per week   Comment: Occasionally when we go out.  One drink every 4 or so mths.    Family History:  Family History  Problem Relation Age of Onset   BRCA 1/2 Neg Hx    Breast cancer Neg Hx     Past medical history, surgical history, medications, allergies, family history and social history reviewed with patient today and changes made to appropriate areas of the chart.   ROS All other ROS negative except what is listed above and in the HPI.      Objective:    BP 118/80 (BP Location: Left Arm, Patient Position: Sitting, Cuff Size: Normal)   Pulse 64   Temp (!) 97.2 F (36.2 C)   Ht 5' 1 (1.549 m)   Wt 152 lb 3.2 oz (69 kg)   SpO2 98%   BMI 28.76 kg/m   Wt Readings from Last 3 Encounters:  11/23/23 152 lb 3.2 oz (69 kg)  06/05/23 151 lb (68.5 kg)  03/06/22 152 lb (68.9 kg)    Physical Exam  Results for orders placed or performed in visit on 06/20/23  HM DIABETES EYE EXAM   Collection Time: 06/19/23 11:43 AM  Result  Value Ref Range   HM Diabetic Eye Exam No Retinopathy No Retinopathy      Assessment & Plan:   Problem List Items Addressed This Visit  None    Follow up plan: No follow-ups on file.   LABORATORY TESTING:  - Pap smear: {Blank single:19197::pap done,not applicable,up to date,done elsewhere}  IMMUNIZATIONS:   - Tdap: Tetanus vaccination status reviewed: {tetanus status:315746}. - Influenza: {Blank single:19197::Up to date,Administered today,Postponed to flu season,Declined,Given elsewhere} - Pneumovax: {Blank single:19197::Up to date,Administered today,Not applicable,Declined,Given elsewhere} - Prevnar: {Blank single:19197::Up to date,Administered today,Not applicable,Declined,Given elsewhere} - HPV: {Blank single:19197::Up to date,Administered today,Not applicable,Declined,Given elsewhere} - Shingrix vaccine: {Blank single:19197::Up to date,Administered today,Not applicable,Declined,Given elsewhere}  SCREENING: -Mammogram: {Blank single:19197::Up to date,Ordered today,Not applicable,Declined,Done elsewhere}  - Colonoscopy: {Blank single:19197::Up to date,Ordered today,Not applicable,Declined,Done elsewhere}  - Bone Density: {Blank single:19197::Up to date,Ordered today,Not applicable,Declined,Done elsewhere}   PATIENT COUNSELING:   Advised to take 1 mg of folate supplement per day if capable of pregnancy.   Sexuality: Discussed sexually transmitted diseases, partner selection, use of condoms, avoidance of unintended pregnancy  and contraceptive alternatives.   Advised to avoid cigarette smoking.  I discussed with the patient that most people either abstain from alcohol or drink within safe limits (<=14/week and <=4 drinks/occasion for males, <=7/weeks and <= 3 drinks/occasion for females) and that the risk for alcohol disorders and other health effects rises proportionally with the number of  drinks per week and how often a drinker exceeds daily limits.  Discussed cessation/primary prevention of drug use and availability of treatment for abuse.   Diet: Encouraged to adjust caloric intake to maintain  or achieve ideal body weight, to reduce intake of dietary saturated fat and total fat, to limit sodium intake by avoiding high sodium foods and not adding table salt, and to maintain adequate dietary potassium and calcium preferably from fresh fruits, vegetables, and low-fat dairy products.    stressed the importance of regular exercise  Injury prevention: Discussed safety belts, safety helmets, smoke detector, smoking near bedding or upholstery.   Dental health: Discussed importance of regular tooth brushing, flossing, and dental visits.    NEXT PREVENTATIVE PHYSICAL DUE IN 1 YEAR. No follow-ups on file.  Riona Lahti A Freddie Dymek

## 2023-11-23 NOTE — Patient Instructions (Signed)
It was great to see you!  We are checking your labs today and will let you know the results via mychart/phone.   Let's follow-up in 3 months, sooner if you have concerns.  If a referral was placed today, you will be contacted for an appointment. Please note that routine referrals can sometimes take up to 3-4 weeks to process. Please call our office if you haven't heard anything after this time frame.  Take care,  Nanako Stopher, NP  

## 2023-11-24 DIAGNOSIS — Z0000 Encounter for general adult medical examination without abnormal findings: Secondary | ICD-10-CM | POA: Insufficient documentation

## 2023-11-24 NOTE — Assessment & Plan Note (Signed)
 Chronic, ongoing. She has noticed an increase in her sugars at home since she has not been able to exercise as much with going back into the office at work. Will check CMP, CBC, lipid panel, A1c, and urine microalbumin today. She prefers PO medications vs injection. Will treat based on results. Follow-up in 3 months.

## 2023-11-24 NOTE — Assessment & Plan Note (Signed)
 Health maintenance reviewed and updated. Discussed nutrition, exercise. Follow-up 1 year.

## 2023-11-25 NOTE — Progress Notes (Unsigned)
 PCP: Nedra Tinnie LABOR, NP   No chief complaint on file.   HPI:      [name] is a 48 y.o. [id number] whose LMP was No LMP recorded. Patient is perimenopausal., presents today for her annual examination.  Her menses are {norm/abn:715}, lasting {number: 22536} days.  Dysmenorrhea {dysmen:716}. She {does:18564} have intermenstrual bleeding. She {does:18564} have vasomotor sx.   Sex activity: {sex active: 315163}. She {does:18564} have vaginal dryness/pain/bleeding.  Last Pap: 05/04/21 Results were: no abnormalities /neg HPV DNA.  Hx of STDs: {STD hx:14358}  Last mammogram: 11/20/23 with PCP; Results were: normal--routine follow-up in 12 months There is no FH of breast cancer. There is no FH of ovarian cancer. The patient {does:18564} do self-breast exams.  Colonoscopy: {hx:15363}  Repeat due after 10*** years.   Tobacco use: {tob:20664} Alcohol use: {Alcohol:11675} No drug use Exercise: {exercise:31265}  She {does:18564} get adequate calcium and Vitamin D in her diet.  Labs with PCP.   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   Diabetes mellitus without complication (HCC) 05/02/2021   Palpitations 04/22/2021   Elevated blood-pressure reading without diagnosis of hypertension 04/22/2021    Past Surgical History:  Procedure Laterality Date   CESAREAN SECTION  06/2000, 06/2002, 12/2007    Family History  Problem Relation Age of Onset   Asthma Mother    BRCA 1/2 Neg Hx    Breast cancer Neg Hx     Social History   Socioeconomic History   Marital status: Married     Spouse name: Not on file   Number of children: Not on file   Years of education: Not on file   Highest education level: Not on file  Occupational History   Not on file  Tobacco Use   Smoking status: Never   Smokeless tobacco: Never  Vaping Use   Vaping status: Never Used  Substance and Sexual Activity   Alcohol use: Yes    Alcohol/week: 1.0 standard drink of alcohol    Types: 1 Glasses of wine per week    Comment: Occasionally when we go out.  One drink every 4 or so mths.   Drug use: Never   Sexual activity: Yes    Birth control/protection: None  Other Topics Concern   Not on file  Social History Narrative   Not on file   Social Drivers of Health   Financial Resource Strain: Low Risk  (06/05/2023)   Received from [facility]   Overall Financial Resource Strain (CARDIA)    Difficulty of Paying Living Expenses: Not hard at all  Food Insecurity: No Food Insecurity (06/05/2023)   Received from [facility]   Hunger Vital Sign    Within the past 12 months, you worried that your food would run out before you got the money to buy more.: Never true    Within the past 12 months, the food you bought just didn't last and you didn't have money to get more.: Never true  Transportation Needs: No Transportation Needs (06/05/2023)   Received from [facility]   [facility] - Transportation  In the past 12 months, has lack of transportation kept you from medical appointments or from getting medications?: No    Lack of Transportation (Non-Medical): No  Physical Activity: Not on file  Stress: Not on file  Social Connections: Not on file  Intimate Partner Violence: Not on file     Current Outpatient Medications:    Blood Glucose Monitoring Suppl (ONE TOUCH ULTRA 2) w/Device KIT, 1 each by Does not apply route daily., Disp: 1 kit, Rfl: 0   cyclobenzaprine (FLEXERIL) 5 MG tablet, Take ONE tab PO BID/TID PRN (Patient not taking: Reported  on 11/23/2023), Disp: , Rfl:    glucose blood (ONETOUCH ULTRA) test strip, USE TO TEST ONCE DAILY AS DIRECTED, Disp: 100 strip, Rfl: 1   meclizine  (ANTIVERT ) 25 MG tablet, Take 1 tablet (25 mg total) by mouth 3 (three) times daily as needed for dizziness. (Patient not taking: Reported on 11/23/2023), Disp: 30 tablet, Rfl: 0   metFORMIN (GLUCOPHAGE-XR) 500 MG 24 hr tablet, Take 1 tablet (500 mg total) by mouth 2 (two) times daily with a meal., Disp: 180 tablet, Rfl: 0   rosuvastatin (CRESTOR) 5 MG tablet, Take 1 tablet (5 mg total) by mouth daily., Disp: 90 tablet, Rfl: 0     ROS:  Review of Systems BREAST: No symptoms    Objective: There were no vitals taken for this visit.   OBGyn Exam  Results: No results found for this or any previous visit (from the past 24 hours).  Assessment/Plan:  No diagnosis found.   No orders of the defined types were placed in this encounter.           GYN counsel {counseling: 16159}    F/U  No follow-ups on file.  Jomayra Novitsky B. Haldon Carley, PA-C 11/25/2023 7:52 PM

## 2023-11-26 ENCOUNTER — Ambulatory Visit: Admitting: Obstetrics and Gynecology

## 2023-11-26 ENCOUNTER — Encounter: Payer: Self-pay | Admitting: Obstetrics and Gynecology

## 2023-11-26 VITALS — BP 127/82 | HR 80 | Ht 61.0 in | Wt 154.0 lb

## 2023-11-26 DIAGNOSIS — Z1231 Encounter for screening mammogram for malignant neoplasm of breast: Secondary | ICD-10-CM

## 2023-11-26 DIAGNOSIS — Z1211 Encounter for screening for malignant neoplasm of colon: Secondary | ICD-10-CM

## 2023-11-26 DIAGNOSIS — Z01419 Encounter for gynecological examination (general) (routine) without abnormal findings: Secondary | ICD-10-CM | POA: Diagnosis not present

## 2023-11-26 NOTE — Patient Instructions (Signed)
 I value your feedback and you entrusting [place] with your care. If you get a [place] patient survey, I would appreciate you taking the time to let us know about your experience today. Thank you! ? ? ?

## 2023-11-28 ENCOUNTER — Telehealth: Payer: Self-pay

## 2023-11-28 NOTE — Telephone Encounter (Signed)
 Patient was identified as falling into the [place] Measure - Diabetes.   Patient was: Appointment scheduled with primary care provider in the next 30 days.

## 2023-12-12 ENCOUNTER — Ambulatory Visit: Admitting: *Deleted

## 2023-12-12 ENCOUNTER — Telehealth (INDEPENDENT_AMBULATORY_CARE_PROVIDER_SITE_OTHER): Admitting: Nurse Practitioner

## 2023-12-12 ENCOUNTER — Encounter: Payer: Self-pay | Admitting: Nurse Practitioner

## 2023-12-12 VITALS — Ht 61.0 in | Wt 154.0 lb

## 2023-12-12 DIAGNOSIS — E119 Type 2 diabetes mellitus without complications: Secondary | ICD-10-CM | POA: Diagnosis not present

## 2023-12-12 DIAGNOSIS — Z7984 Long term (current) use of oral hypoglycemic drugs: Secondary | ICD-10-CM | POA: Insufficient documentation

## 2023-12-12 DIAGNOSIS — F411 Generalized anxiety disorder: Secondary | ICD-10-CM | POA: Insufficient documentation

## 2023-12-12 DIAGNOSIS — Z1211 Encounter for screening for malignant neoplasm of colon: Secondary | ICD-10-CM

## 2023-12-12 MED ORDER — FREESTYLE LIBRE 3 PLUS SENSOR MISC: 3 refills | Status: AC

## 2023-12-12 MED ORDER — NA SULFATE-K SULFATE-MG SULF 17.5-3.13-1.6 GM/177ML PO SOLN: 1.0000 | Freq: Once | ORAL | 0 refills | Status: AC

## 2023-12-12 MED ORDER — ONETOUCH ULTRA VI STRP: ORAL_STRIP | 3 refills | Status: AC

## 2023-12-12 NOTE — Progress Notes (Signed)
 [facility] [address] [place] [address] Dept: [phone] Dept Fax: [phone]  Virtual Video Visit  I connected with [name] on 12/12/23 at  4:00 PM EST by a video enabled telemedicine application and verified that I am speaking with the correct person using two identifiers.  Location patient: Home Location provider: Clinic Persons participating in the virtual visit: Patient; Courtney Harada, NP; Laymon Gladis Sharps, CMA  I discussed the limitations of evaluation and management by telemedicine and the availability of in person appointments. The patient expressed understanding and agreed to proceed.  Chief Complaint  Patient presents with   Paperwork Completion    Request paperwork completion to be abe to work from home, discuss another way to check glucose    SUBJECTIVE:  HPI:   Discussed the use of AI scribe software for clinical note transcription with the patient, who gave verbal consent to proceed.  History of Present Illness   Courtney Koch is a 48 year old female with diabetes who presents with concerns about low blood sugar levels and anxiety.  She experiences low blood sugar levels, with the lowest recorded at 79 mg/dL, while on metformin. Her blood sugar levels have been stable, not exceeding 130 mg/dL. She is interested in alternative methods for monitoring her blood sugar levels due to the inconvenience of frequent finger pricks.  She experiences anxiety and stress related to her health and work. She feels nervous about her health status and the potential impact of returning to the office. She has experienced heart racing on two occasions and feels hot at times. She has trouble sleeping, which she attributes to stress and possibly hormonal changes. She prefers non-medication approaches to managing her anxiety.  Her work is very busy and stressful, contributing to her anxiety. She is exploring  reasonable accommodations to work from home to manage her stress better. She has a history of sleep disturbances, which worsened after the loss of her mother, and has tried to manage her sleep by avoiding phone use at night.        11/23/2023    9:01 AM 06/01/2021    8:30 AM 05/23/2021   11:10 AM 04/22/2021   11:14 AM  Depression screen PHQ 2/9  Decreased Interest 0 0 0   Down, Depressed, Hopeless 0 0 0 0  PHQ - 2 Score 0 0 0 0  Altered sleeping 0  1 0  Tired, decreased energy 0  0 0  Change in appetite 2  0 0  Feeling bad or failure about yourself  0  0 0  Trouble concentrating 0  0 0  Moving slowly or fidgety/restless 0  0 0  Suicidal thoughts 0  0 0  PHQ-9 Score 2  1 0  Difficult doing work/chores Somewhat difficult  Not difficult at all       11/23/2023    9:01 AM 05/23/2021   11:11 AM 04/22/2021   11:14 AM  GAD 7 : Generalized Anxiety Score  Nervous, Anxious, on Edge 0 0 0  Control/stop worrying 0 0 0  Worry too much - different things 0 0 0  Trouble relaxing 0 0 1  Restless 0 0 0  Easily annoyed or irritable 2 0 0  Afraid - awful might happen 0 0 0  Total GAD 7 Score 2 0 1  Anxiety Difficulty Somewhat difficult Not difficult at all     Patient Active Problem List   Diagnosis Date Noted  Generalized anxiety disorder 12/12/2023   Long term current use of oral hypoglycemic drug 12/12/2023   Routine general medical examination at a health care facility 11/24/2023   Gastroesophageal reflux disease 03/06/2022   Sacroiliac joint pain 11/19/2021   Lumbar radiculopathy 11/19/2021   Low back strain 11/19/2021   Low back pain 11/19/2021   Lumbar spondylosis 11/19/2021   Arthropathy of lumbar facet joint 11/19/2021   Lumbar adjacent segment disease with spondylolisthesis 11/19/2021   Diabetes mellitus without complication (HCC) 05/02/2021   Palpitations 04/22/2021   Elevated blood-pressure reading without diagnosis of hypertension 04/22/2021    Past Surgical History:   Procedure Laterality Date   CESAREAN SECTION  06/2000, 06/2002, 12/2007    Family History  Problem Relation Age of Onset   Asthma Mother    Colon polyps Maternal Grandfather    BRCA 1/2 Neg Hx    Breast cancer Neg Hx    Colon cancer Neg Hx    Esophageal cancer Neg Hx    Stomach cancer Neg Hx    Rectal cancer Neg Hx     Social History   Tobacco Use   Smoking status: Never   Smokeless tobacco: Never  Vaping Use   Vaping status: Never Used  Substance Use Topics   Alcohol use: Yes    Alcohol/week: 1.0 standard drink of alcohol    Types: 1 Glasses of wine per week    Comment: Occasionally when we go out.  One drink every 4 or so mths.   Drug use: Never     Current Outpatient Medications:    Blood Glucose Monitoring Suppl (ONE TOUCH ULTRA 2) w/Device KIT, 1 each by Does not apply route daily., Disp: 1 kit, Rfl: 0   Cholecalciferol (VITAMIN D3) 10 MCG (400 UNIT) CAPS, Take by mouth., Disp: , Rfl:    Continuous Glucose Sensor (FREESTYLE LIBRE 3 PLUS SENSOR) MISC, Change sensor every 15 days., Disp: 6 each, Rfl: 3   cyclobenzaprine (FLEXERIL) 5 MG tablet, Take ONE tab PO BID/TID PRN (Patient taking differently: Take 5 mg by mouth as needed.), Disp: , Rfl:    meclizine  (ANTIVERT ) 25 MG tablet, Take 1 tablet (25 mg total) by mouth 3 (three) times daily as needed for dizziness., Disp: 30 tablet, Rfl: 0   metFORMIN (GLUCOPHAGE-XR) 500 MG 24 hr tablet, Take 1 tablet (500 mg total) by mouth 2 (two) times daily with a meal., Disp: 180 tablet, Rfl: 0   Multiple Vitamin (MULTIVITAMIN ADULT PO), Take by mouth daily at 12 noon., Disp: , Rfl:    rosuvastatin (CRESTOR) 5 MG tablet, Take 1 tablet (5 mg total) by mouth daily., Disp: 90 tablet, Rfl: 0   glucose blood (ONETOUCH ULTRA) test strip, USE TO TEST ONCE DAILY AS DIRECTED, Disp: 100 strip, Rfl: 3   Na Sulfate-K Sulfate-Mg Sulfate concentrate (SUPREP) 17.5-3.13-1.6 GM/177ML SOLN, Take 1 kit (354 mLs total) by mouth once for 1 dose. (Patient  not taking: Reported on 12/12/2023), Disp: 354 mL, Rfl: 0  Allergies  Allergen Reactions   Oxycodone-Acetaminophen Anaphylaxis, Swelling and Rash    ROS: See pertinent positives and negatives per HPI.  OBSERVATIONS/OBJECTIVE:  VITALS per patient if applicable: Today's Vitals   12/12/23 1608  Weight: 154 lb (69.9 kg)  Height: 5' 1 (1.549 m)   Body mass index is 29.1 kg/m.    GENERAL: Alert and oriented. Appears well and in no acute distress.  HEENT: Atraumatic. Conjunctiva clear. No obvious abnormalities on inspection of external nose and ears.  NECK: Normal  movements of the head and neck.  LUNGS: On inspection, no signs of respiratory distress. Breathing rate appears normal. No obvious gross SOB, gasping or wheezing, and no conversational dyspnea.  CV: No obvious cyanosis.  MS: Moves all visible extremities without noticeable abnormality.  PSYCH/NEURO: Pleasant and cooperative. No obvious depression or anxiety. Speech and thought processing grossly intact.  ASSESSMENT AND PLAN:  Problem List Items Addressed This Visit       Endocrine   Diabetes mellitus without complication (HCC) - Primary   Chronic, stable. She is interested in alternative glucose monitoring methods. Will send in freestyle libre 3 plus to see if insurance covers this. Continue metformin XR 500mg  BID. She has been trying to adjust her diet and walk more frequently. Follow-up in 2 months at next scheduled visit.       Relevant Medications   Continuous Glucose Sensor (FREESTYLE LIBRE 3 PLUS SENSOR) MISC     Other   Generalized anxiety disorder   Chronic, not controlled. She experiences anxiety related to health and prefers non-pharmacological interventions. Referred to a therapist for anxiety management. Encouraged regular walking and exercise. Advised on maintaining a healthy diet and avoiding caffeine in the evening. Will provide work accommodations to allow her to work from home to assist with her  anxiety as it gets worse when she has to go to the office. Follow-up in 6-8 weeks.       Relevant Orders   Ambulatory referral to Psychology   Long term current use of oral hypoglycemic drug   Continue metformin XR 500mg  BID       I discussed the assessment and treatment plan with the patient. The patient was provided an opportunity to ask questions and all were answered. The patient agreed with the plan and demonstrated an understanding of the instructions.   The patient was advised to call back or seek an in-person evaluation if the symptoms worsen or if the condition fails to improve as anticipated.   Courtney DELENA Harada, NP

## 2023-12-12 NOTE — Patient Instructions (Signed)
 It was great to see you!  I have ordered a freestyle libre continuous glucose monitor and will see if insurance covers this, if not it would cost $75 per month.   I have placed a referral to a therapist, they will call to schedule   I will fill out any forms that you send or drop off at the office for accommodations at work   Let's follow-up in 2 months, sooner if you have concerns.  If a referral was placed today, you will be contacted for an appointment. Please note that routine referrals can sometimes take up to 3-4 weeks to process. Please call our office if you haven't heard anything after this time frame.  Take care,  [name], NP

## 2023-12-12 NOTE — Assessment & Plan Note (Signed)
 Chronic, stable. She is interested in alternative glucose monitoring methods. Will send in freestyle libre 3 plus to see if insurance covers this. Continue metformin XR 500mg  BID. She has been trying to adjust her diet and walk more frequently. Follow-up in 2 months at next scheduled visit.

## 2023-12-12 NOTE — Assessment & Plan Note (Signed)
 Chronic, not controlled. She experiences anxiety related to health and prefers non-pharmacological interventions. Referred to a therapist for anxiety management. Encouraged regular walking and exercise. Advised on maintaining a healthy diet and avoiding caffeine in the evening. Will provide work accommodations to allow her to work from home to assist with her anxiety as it gets worse when she has to go to the office. Follow-up in 6-8 weeks.

## 2023-12-12 NOTE — Progress Notes (Signed)
 Pt's name and DOB verified at the beginning of the pre-visit with 2 identifiers  Pt denies any difficulty with ambulating,sitting, laying down or rolling side to side  Pt has no issues moving head neck or swallowing  No egg or soy allergy known to patient   No issues known to pt with past sedation  No FH of Malignant Hyperthermia  Pt is not on home 02   Pt is not on blood thinners   Pt denies issues with constipation   Pt has frequent issues with constipation RN instructed pt to use Miralax per bottles instructions a week before prep days. Pt states they will  Pt is not on dialysis  Pt denise any abnormal heart rhythms   Pt denies any upcoming cardiac testing  Patient's chart reviewed by Norleen Schillings CNRA prior to pre-visit and patient appropriate for the [facility].  Pre-visit completed and red dot placed by patient's name on their procedure day (on provider's schedule).     Visit by phone/Video  Pt states weight is 154 lb  Pt given  both [facility] main # and MD on call # prior to instructions.  Informed pt to come in at the time discussed and is shown on PV instructions.  Pt instructed to use [employer] or GoodRx for a price reduction on prep  Instructed pt where to find PV instructions in My [facility]. Copy of instructions  to be sent in mail and address read back to pt to verify correct on envelope. Instructed pt on all aspects of written instructions including med holds clothing to wear and foods to eat and not eat as well as after procedure legal restrictions and to call MD on call if needed.. Pt states understanding. Instructed pt to review instructions again prior to procedure and call main # given if has any questions or any issues. Pt states they will.

## 2023-12-12 NOTE — Assessment & Plan Note (Signed)
 Continue metformin XR 500mg  BID

## 2023-12-24 ENCOUNTER — Encounter: Payer: Self-pay | Admitting: Nurse Practitioner

## 2023-12-26 ENCOUNTER — Encounter: Admitting: Gastroenterology

## 2024-01-05 LAB — COLOGUARD: COLOGUARD: NEGATIVE

## 2024-02-22 NOTE — Assessment & Plan Note (Signed)
 Chronic, not controlled. She experiences anxiety related to health and prefers non-pharmacological interventions. Referred to a therapist for anxiety management. Encouraged regular walking and exercise. Advised on maintaining a healthy diet and avoiding caffeine in the evening. Will provide work accommodations to allow her to work from home to assist with her anxiety as it gets worse when she has to go to the office. Follow-up in 6-8 weeks.

## 2024-02-22 NOTE — Assessment & Plan Note (Signed)
 Chronic, stable. She is interested in alternative glucose monitoring methods. Will send in freestyle libre 3 plus to see if insurance covers this. Continue metformin XR 500mg  BID. She has been trying to adjust her diet and walk more frequently. Follow-up in 2 months at next scheduled visit.

## 2024-02-22 NOTE — Progress Notes (Incomplete)
 "  Established Visit  There were no vitals taken for this visit.   Subjective:    Patient ID: Courtney Koch, female    DOB: 10/10/1975, 49 y.o.   MRN: [id number]  CC: No chief complaint on file.   HPI: Courtney Koch is a 49 y.o. female presents for follow up.   Courtney Koch has a history of diabetes, for which she is taking Metformin  500 BID. At our last visit, she'd been having some low blood sugar levels, lowest at 79 mg/dL and no higher than 869 mg/dL.   Courtney Koch mentioned having some anxiety, so I had placed a referral for her to see Psychology. She attributed her anxiety to work and other stress.  Past Medical History:  Diagnosis Date   Allergy 06/2000   GERD (gastroesophageal reflux disease)    Gestational diabetes    and type 2 DM   Hyperlipidemia    Palpitations     Past Surgical History:  Procedure Laterality Date   CESAREAN SECTION  06/2000, 06/2002, 12/2007    Family History  Problem Relation Age of Onset   Asthma Mother    Colon polyps Maternal Grandfather    BRCA 1/2 Neg Hx    Breast cancer Neg Hx    Colon cancer Neg Hx    Esophageal cancer Neg Hx    Stomach cancer Neg Hx    Rectal cancer Neg Hx      Social History[1]  Medications Ordered Prior to Encounter[2]   Review of Systems      Objective:    There were no vitals taken for this visit.  Wt Readings from Last 3 Encounters:  12/12/23 154 lb (69.9 kg)  12/12/23 154 lb (69.9 kg)  11/26/23 154 lb (69.9 kg)    BP Readings from Last 3 Encounters:  11/26/23 127/82  11/23/23 118/80  06/05/23 (!) 149/91    Physical Exam     Assessment & Plan:   Problem List Items Addressed This Visit   None    Follow up plan: No follow-ups on file.  Courtney DELENA Harada, NP  I,Courtney Koch,acting as a scribe for [employer], NP.,have documented all relevant documentation on the behalf of [name], NP.  I, Courtney DELENA Harada, NP, have reviewed all documentation for this visit. The  documentation on 02/25/2024 for the exam, diagnosis, procedures, and orders are all accurate and complete.    [1]  Social History Tobacco Use   Smoking status: Never   Smokeless tobacco: Never  Vaping Use   Vaping status: Never Used  Substance Use Topics   Alcohol use: Yes    Alcohol/week: 1.0 standard drink of alcohol    Types: 1 Glasses of wine per week    Comment: Occasionally when we go out.  One drink every 4 or so mths.   Drug use: Never  [2]  Current Outpatient Medications on File Prior to Visit  Medication Sig Dispense Refill   Blood Glucose Monitoring Suppl (ONE TOUCH ULTRA 2) w/Device KIT 1 each by Does not apply route daily. 1 kit 0   Cholecalciferol (VITAMIN D3) 10 MCG (400 UNIT) CAPS Take by mouth.     Continuous Glucose Sensor (FREESTYLE LIBRE 3 PLUS SENSOR) MISC Change sensor every 15 days. 6 each 3   cyclobenzaprine (FLEXERIL) 5 MG tablet Take ONE tab PO BID/TID PRN (Patient taking differently: Take 5 mg by mouth as needed.)     glucose blood (ONETOUCH ULTRA) test strip USE TO TEST ONCE  DAILY AS DIRECTED 100 strip 3   meclizine  (ANTIVERT ) 25 MG tablet Take 1 tablet (25 mg total) by mouth 3 (three) times daily as needed for dizziness. 30 tablet 0   metFORMIN  (GLUCOPHAGE -XR) 500 MG 24 hr tablet Take 1 tablet (500 mg total) by mouth 2 (two) times daily with a meal. 180 tablet 0   Multiple Vitamin (MULTIVITAMIN ADULT PO) Take by mouth daily at 12 noon.     rosuvastatin  (CRESTOR ) 5 MG tablet Take 1 tablet (5 mg total) by mouth daily. 90 tablet 0   No current facility-administered medications on file prior to visit.   "

## 2024-02-22 NOTE — Assessment & Plan Note (Signed)
 Continue metformin XR 500mg  BID

## 2024-02-25 ENCOUNTER — Ambulatory Visit: Admitting: Nurse Practitioner

## 2024-03-17 ENCOUNTER — Ambulatory Visit: Admitting: Nurse Practitioner

## 2024-04-09 ENCOUNTER — Ambulatory Visit: Admitting: Nurse Practitioner
